# Patient Record
Sex: Female | Born: 1950 | Race: Black or African American | Hispanic: No | Smoking: Former smoker
Health system: Southern US, Community
[De-identification: ages and names within clinical notes are randomized; demographics above are authoritative.]

## PROBLEM LIST (undated history)

## (undated) DIAGNOSIS — F329 Major depressive disorder, single episode, unspecified: Secondary | ICD-10-CM

## (undated) DIAGNOSIS — K219 Gastro-esophageal reflux disease without esophagitis: Secondary | ICD-10-CM

## (undated) DIAGNOSIS — E039 Hypothyroidism, unspecified: Secondary | ICD-10-CM

## (undated) DIAGNOSIS — K635 Polyp of colon: Secondary | ICD-10-CM

## (undated) DIAGNOSIS — R519 Headache, unspecified: Secondary | ICD-10-CM

## (undated) DIAGNOSIS — B019 Varicella without complication: Secondary | ICD-10-CM

## (undated) DIAGNOSIS — R51 Headache: Secondary | ICD-10-CM

## (undated) DIAGNOSIS — F32A Depression, unspecified: Secondary | ICD-10-CM

## (undated) DIAGNOSIS — N189 Chronic kidney disease, unspecified: Secondary | ICD-10-CM

## (undated) DIAGNOSIS — E079 Disorder of thyroid, unspecified: Secondary | ICD-10-CM

## (undated) HISTORY — PX: DILATION AND CURETTAGE OF UTERUS: SHX78

## (undated) HISTORY — PX: TUBAL LIGATION: SHX77

## (undated) HISTORY — DX: Polyp of colon: K63.5

## (undated) HISTORY — DX: Varicella without complication: B01.9

## (undated) HISTORY — PX: HAMMER TOE SURGERY: SHX385

## (undated) HISTORY — DX: Gastro-esophageal reflux disease without esophagitis: K21.9

## (undated) HISTORY — DX: Headache, unspecified: R51.9

## (undated) HISTORY — DX: Headache: R51

## (undated) HISTORY — DX: Disorder of thyroid, unspecified: E07.9

---

## 1999-03-17 ENCOUNTER — Encounter: Payer: Self-pay | Admitting: Emergency Medicine

## 1999-03-17 ENCOUNTER — Emergency Department (HOSPITAL_COMMUNITY): Admission: EM | Admit: 1999-03-17 | Discharge: 1999-03-18 | Payer: Self-pay | Admitting: Emergency Medicine

## 1999-11-27 ENCOUNTER — Encounter: Payer: Self-pay | Admitting: Emergency Medicine

## 1999-11-27 ENCOUNTER — Emergency Department (HOSPITAL_COMMUNITY): Admission: EM | Admit: 1999-11-27 | Discharge: 1999-11-28 | Payer: Self-pay | Admitting: Emergency Medicine

## 2000-11-24 ENCOUNTER — Other Ambulatory Visit: Admission: RE | Admit: 2000-11-24 | Discharge: 2000-11-24 | Payer: Self-pay | Admitting: Obstetrics and Gynecology

## 2002-06-28 ENCOUNTER — Ambulatory Visit (HOSPITAL_COMMUNITY): Admission: RE | Admit: 2002-06-28 | Discharge: 2002-06-28 | Payer: Self-pay | Admitting: Family Medicine

## 2003-09-29 ENCOUNTER — Encounter: Admission: RE | Admit: 2003-09-29 | Discharge: 2003-09-29 | Payer: Self-pay | Admitting: Family Medicine

## 2003-09-30 ENCOUNTER — Encounter: Admission: RE | Admit: 2003-09-30 | Discharge: 2003-09-30 | Payer: Self-pay | Admitting: Family Medicine

## 2003-10-14 ENCOUNTER — Emergency Department (HOSPITAL_COMMUNITY): Admission: EM | Admit: 2003-10-14 | Discharge: 2003-10-14 | Payer: Self-pay | Admitting: Emergency Medicine

## 2003-10-26 ENCOUNTER — Encounter: Admission: RE | Admit: 2003-10-26 | Discharge: 2003-10-26 | Payer: Self-pay | Admitting: Family Medicine

## 2003-11-21 ENCOUNTER — Other Ambulatory Visit: Admission: RE | Admit: 2003-11-21 | Discharge: 2003-11-21 | Payer: Self-pay | Admitting: Obstetrics and Gynecology

## 2004-05-07 ENCOUNTER — Ambulatory Visit (HOSPITAL_COMMUNITY): Admission: RE | Admit: 2004-05-07 | Discharge: 2004-05-07 | Payer: Self-pay | Admitting: Gastroenterology

## 2004-10-09 ENCOUNTER — Emergency Department (HOSPITAL_COMMUNITY): Admission: EM | Admit: 2004-10-09 | Discharge: 2004-10-09 | Payer: Self-pay | Admitting: Emergency Medicine

## 2010-09-29 ENCOUNTER — Encounter: Payer: Self-pay | Admitting: Family Medicine

## 2012-05-19 ENCOUNTER — Ambulatory Visit (HOSPITAL_COMMUNITY): Payer: Self-pay

## 2012-06-09 ENCOUNTER — Other Ambulatory Visit: Payer: Self-pay | Admitting: Obstetrics and Gynecology

## 2012-06-09 DIAGNOSIS — Z1231 Encounter for screening mammogram for malignant neoplasm of breast: Secondary | ICD-10-CM

## 2012-06-10 ENCOUNTER — Encounter (HOSPITAL_COMMUNITY): Payer: Self-pay | Admitting: *Deleted

## 2012-06-11 ENCOUNTER — Emergency Department (HOSPITAL_COMMUNITY)
Admission: EM | Admit: 2012-06-11 | Discharge: 2012-06-11 | Disposition: A | Discharge status: Home / Self Care | Payer: Self-pay | Attending: Emergency Medicine | Admitting: Emergency Medicine

## 2012-06-11 ENCOUNTER — Encounter (HOSPITAL_COMMUNITY): Payer: Self-pay | Admitting: Physical Medicine and Rehabilitation

## 2012-06-11 DIAGNOSIS — K089 Disorder of teeth and supporting structures, unspecified: Secondary | ICD-10-CM | POA: Insufficient documentation

## 2012-06-11 DIAGNOSIS — K0889 Other specified disorders of teeth and supporting structures: Secondary | ICD-10-CM

## 2012-06-11 MED ORDER — NAPROXEN 500 MG PO TABS: 500.0000 mg | ORAL_TABLET | Freq: Two times a day (BID) | ORAL | Status: DC

## 2012-06-11 MED ORDER — PENICILLIN V POTASSIUM 500 MG PO TABS: 500.0000 mg | ORAL_TABLET | Freq: Four times a day (QID) | ORAL | Status: AC

## 2012-06-11 MED ORDER — IBUPROFEN 400 MG PO TABS
400.0000 mg | ORAL_TABLET | Freq: Once | ORAL | Status: AC
  Administered 2012-06-11: 400 mg via ORAL
  Filled 2012-06-11: qty 1

## 2012-06-11 MED ORDER — OXYCODONE-ACETAMINOPHEN 5-325 MG PO TABS: 1.0000 | ORAL_TABLET | ORAL | Status: DC | PRN

## 2012-06-11 NOTE — ED Notes (Signed)
Pt presents to department for evaluation of L upper and lower molar toothache. Ongoing for several days, pain increased this morning. Does not have insurance and is unable to see dentist. 9/10 pain at the time. No signs of distress noted.

## 2012-06-11 NOTE — ED Provider Notes (Signed)
History  Scribed for Raeford Razor, MD, the patient was seen in room TR05C/TR05C. This chart was scribed by Candelaria Stagers. The patient's care started at 1:43 PM   CSN: [id number]  Arrival date & time 06/11/12  1217   First MD Initiated Contact with Patient 06/11/12 1336      Chief Complaint  Patient presents with  . Dental Pain    The history is provided by the patient. No language interpreter was used.   Vlada D. Fickett is a 61 y.o. female who presents to the Emergency Department complaining of left upper and lower dental pain that stared about three weeks ago and has gotten worse.  She states she is experiencing swelling, gum pain, and left sided facial pain.  She denies trouble breathing or swallowing.  She has no other medical problems. Pt has not seen the dentist and states that she does not currently have insurance.   No past medical history on file.  No past surgical history on file.  No family history on file.  History  Substance Use Topics  . Smoking status: Never Smoker   . Smokeless tobacco: Not on file  . Alcohol Use: No    OB History    Grav Para Term Preterm Abortions TAB SAB Ect Mult Living                  Review of Systems  HENT: Positive for facial swelling and dental problem. Negative for trouble swallowing.   Respiratory: Negative for shortness of breath.   All other systems reviewed and are negative.    Allergies  Review of patient's allergies indicates no known allergies.  Home Medications  No current outpatient prescriptions on file.  BP 119/77  Pulse 92  Temp 98.2 F (36.8 C) (Oral)  Resp 16  SpO2 96%  Physical Exam  Nursing note and vitals reviewed. Constitutional: She is oriented to person, place, and time. She appears well-developed and well-nourished. No distress.  HENT:  Head: Normocephalic and atraumatic.       Dental carries.  No obv drain abs.  Tooth 17 and tooth 19 are     Eyes: EOM are normal. Pupils are equal, round,  and reactive to light.  Neck: Neck supple. No tracheal deviation present.  Cardiovascular: Normal rate.   Pulmonary/Chest: Effort normal. No respiratory distress. She has no wheezes. She has no rales.  Musculoskeletal: Normal range of motion. She exhibits no edema.  Neurological: She is alert and oriented to person, place, and time.  Skin: Skin is warm and dry.  Psychiatric: She has a normal mood and affect. Her behavior is normal.    ED Course  Procedures   DIAGNOSTIC STUDIES: Oxygen Saturation is 96% on room air, normal by my interpretation.    COORDINATION OF CARE:     Labs Reviewed - No data to display No results found.   1. Pain, dental       MDM  61 year old female with dental pain. Patient offered dental block but declining. There is no evidence of deep space neck infection. Prescriptions for pain medication was provided. Dental resources were provided as well. Return precautions were discussed.  I personally preformed the services scribed in my presence. The recorded information has been reviewed and considered. Raeford Razor, MD.        Raeford Razor, MD 06/15/12 [phone]

## 2012-06-11 NOTE — ED Notes (Signed)
Pt reports upper and lower left molar pain X3 weeks.  Unable to see a dentist.  Pain 7/10 but increases when not using oragel.  Pt alert oriented X4

## 2012-06-16 ENCOUNTER — Other Ambulatory Visit: Payer: Self-pay | Admitting: Obstetrics and Gynecology

## 2012-06-16 ENCOUNTER — Encounter (HOSPITAL_COMMUNITY): Payer: Self-pay

## 2012-06-16 ENCOUNTER — Ambulatory Visit (HOSPITAL_COMMUNITY)
Admission: RE | Admit: 2012-06-16 | Discharge: 2012-06-16 | Disposition: A | Discharge status: Home / Self Care | Payer: Self-pay | Source: Ambulatory Visit | Attending: Obstetrics and Gynecology | Admitting: Obstetrics and Gynecology

## 2012-06-16 ENCOUNTER — Other Ambulatory Visit (HOSPITAL_COMMUNITY)
Admission: RE | Admit: 2012-06-16 | Discharge: 2012-06-16 | Disposition: A | Discharge status: Home / Self Care | Payer: Self-pay | Source: Ambulatory Visit | Attending: Obstetrics and Gynecology | Admitting: Obstetrics and Gynecology

## 2012-06-16 DIAGNOSIS — Z01419 Encounter for gynecological examination (general) (routine) without abnormal findings: Secondary | ICD-10-CM | POA: Insufficient documentation

## 2012-06-16 DIAGNOSIS — Z1231 Encounter for screening mammogram for malignant neoplasm of breast: Secondary | ICD-10-CM

## 2012-06-29 NOTE — Progress Notes (Signed)
Detailed notes scanned into EPIC under media. 

## 2012-06-29 NOTE — Patient Instructions (Signed)
Detailed notes scanned into EPIC under media. 

## 2012-07-08 ENCOUNTER — Encounter (HOSPITAL_COMMUNITY): Payer: Self-pay | Admitting: *Deleted

## 2012-07-09 ENCOUNTER — Encounter (HOSPITAL_COMMUNITY): Payer: Self-pay | Admitting: *Deleted

## 2012-07-10 ENCOUNTER — Encounter (HOSPITAL_COMMUNITY): Payer: Self-pay | Admitting: *Deleted

## 2012-08-19 NOTE — Addendum Note (Signed)
Encounter addended by: Saintclair Halsted, RN on: 08/19/2012  2:50 PM<BR>     Documentation filed: Visit Diagnoses, Charges VN

## 2012-08-19 NOTE — Addendum Note (Signed)
Encounter addended by: Saintclair Halsted, RN on: 08/19/2012  2:51 PM<BR>     Documentation filed: Visit Diagnoses

## 2012-09-10 NOTE — Addendum Note (Signed)
Encounter addended by: Yamir Carignan Poll Zaiyah Sottile, RN on: 09/10/2012  4:03 PM<BR>     Documentation filed: Visit Diagnoses

## 2013-06-16 ENCOUNTER — Telehealth (HOSPITAL_COMMUNITY): Payer: Self-pay | Admitting: *Deleted

## 2013-06-16 NOTE — Telephone Encounter (Signed)
Telephoned patient at home # and left message to return call to [facility] 

## 2013-07-27 ENCOUNTER — Encounter: Payer: Self-pay | Admitting: Internal Medicine

## 2013-07-27 ENCOUNTER — Ambulatory Visit (INDEPENDENT_AMBULATORY_CARE_PROVIDER_SITE_OTHER): Payer: BC Managed Care – PPO | Admitting: Internal Medicine

## 2013-07-27 VITALS — BP 116/80 | HR 67 | Temp 97.8°F | Wt 158.2 lb

## 2013-07-27 DIAGNOSIS — Z0000 Encounter for general adult medical examination without abnormal findings: Secondary | ICD-10-CM

## 2013-07-27 DIAGNOSIS — K219 Gastro-esophageal reflux disease without esophagitis: Secondary | ICD-10-CM

## 2013-07-27 MED ORDER — PANTOPRAZOLE SODIUM 40 MG PO TBEC: 40.0000 mg | DELAYED_RELEASE_TABLET | Freq: Every day | ORAL | Status: DC

## 2013-07-27 NOTE — Progress Notes (Signed)
HPI  Pt presents to the clinic today to establish care. She has not seen a doctor in a few years. She has had some issues with her stomach. She does have real bad heartburn. It seems like more it is occuring more frequently. She does not take anything OTC for her reflux. She also c/o right breast pain. This started about 3 months. She reports that it is tender to touch but she does not feel a lump or a mass. She does not want to address the breast issue today.  Flu: never Tetanus: not up to date Pap Smear: 2013 Mammogram: 2013 Colonoscopy: 8-9 years ago Eye Doctor: as needed Dentist: as needed   Past Medical History  Diagnosis Date  . Chicken pox   . Thyroid disease   . Colon polyps   . GERD (gastroesophageal reflux disease)   . Frequent headaches     No current outpatient prescriptions on file.   No current facility-administered medications for this visit.    No Known Allergies  History reviewed. No pertinent family history.  History   Social History  . Marital Status: Single    Spouse Name: N/A    Number of Children: N/A  . Years of Education: N/A   Occupational History  . Not on file.   Social History Main Topics  . Smoking status: Never Smoker   . Smokeless tobacco: Not on file  . Alcohol Use: No  . Drug Use: No  . Sexual Activity:    Other Topics Concern  . Not on file   Social History Narrative  . No narrative on file    ROS:  Constitutional: Denies fever, malaise, fatigue, headache or abrupt weight changes.  HEENT: Denies eye pain, eye redness, ear pain, ringing in the ears, wax buildup, runny nose, nasal congestion, bloody nose, or sore throat. Respiratory: Denies difficulty breathing, shortness of breath, cough or sputum production.   Cardiovascular: Denies chest pain, chest tightness, palpitations or swelling in the hands or feet.  Gastrointestinal: Denies abdominal pain, bloating, constipation, diarrhea or blood in the stool.  GU: Denies  frequency, urgency, pain with urination, blood in urine, odor or discharge. Musculoskeletal: Denies decrease in range of motion, difficulty with gait, muscle pain or joint pain and swelling.  Skin: Denies redness, rashes, lesions or ulcercations.  Neurological: Denies dizziness, difficulty with memory, difficulty with speech or problems with balance and coordination.   No other specific complaints in a complete review of systems (except as listed in HPI above).  PE:  BP 116/80  Pulse 67  Temp(Src) 97.8 F (36.6 C) (Oral)  Wt 158 lb 4 oz (71.782 kg)  SpO2 98% Wt Readings from Last 3 Encounters:  07/27/13 158 lb 4 oz (71.782 kg)    General: Appears her stated age, overweight well developed, well nourished in NAD. HEENT: Head: normal shape and size; Eyes: sclera white, no icterus, conjunctiva pink, PERRLA and EOMs intact; Ears: Tm's gray and intact, normal light reflex; Nose: mucosa pink and moist, septum midline; Throat/Mouth: Teeth present, mucosa pink and moist, no lesions or ulcerations noted.  Neck: Normal range of motion. Neck supple, trachea midline. No massses, lumps or thyromegaly present.  Cardiovascular: Normal rate and rhythm. S1,S2 noted.  No murmur, rubs or gallops noted. No JVD or BLE edema. No carotid bruits noted. Pulmonary/Chest: Normal effort and positive vesicular breath sounds. No respiratory distress. No wheezes, rales or ronchi noted.  Abdomen: Soft and nontender. Normal bowel sounds, no bruits noted. No distention or  masses noted. Liver, spleen and kidneys non palpable. Musculoskeletal: Normal range of motion. No signs of joint swelling. No difficulty with gait.  Neurological: Alert and oriented. Cranial nerves II-XII intact. Coordination normal. +DTRs bilaterally. Psychiatric: Mood and affect normal. Behavior is normal. Judgment and thought content normal.      Assessment and Plan:  Preventative Health Maintenance:  Pt declines all screening vaccines  today Will obtain screening labs Will start protonix daily for reflux Let me know in 2 weeks if reflux not improved  RTC in 1 month to follow up GERD

## 2013-07-27 NOTE — Patient Instructions (Signed)

## 2013-07-27 NOTE — Progress Notes (Signed)
Pre-visit discussion using our clinic review tool. No additional management support is needed unless otherwise documented below in the visit note.  

## 2013-07-28 LAB — CBC
HCT: 40.9 % (ref 36.0–46.0)
Hemoglobin: 13.5 g/dL (ref 12.0–15.0)
MCV: 84.1 fl (ref 78.0–100.0)
RBC: 4.86 Mil/uL (ref 3.87–5.11)

## 2013-07-28 LAB — LIPID PANEL
Cholesterol: 208 mg/dL — ABNORMAL HIGH (ref 0–200)
HDL: 55.4 mg/dL (ref >39.00)
Total CHOL/HDL Ratio: 4
Triglycerides: 132 mg/dL (ref 0.0–149.0)
VLDL: 26.4 mg/dL (ref 0.0–40.0)

## 2013-07-28 LAB — COMPREHENSIVE METABOLIC PANEL
Alkaline Phosphatase: 63 U/L (ref 39–117)
BUN: 14 mg/dL (ref 6–23)
Creatinine, Ser: 0.9 mg/dL (ref 0.4–1.2)
Glucose, Bld: 122 mg/dL — ABNORMAL HIGH (ref 70–99)
Sodium: 137 mEq/L (ref 135–145)
Total Bilirubin: 0.5 mg/dL (ref 0.3–1.2)

## 2013-09-06 ENCOUNTER — Ambulatory Visit
Admission: RE | Admit: 2013-09-06 | Discharge: 2013-09-06 | Disposition: A | Discharge status: Home / Self Care | Payer: BC Managed Care – PPO | Source: Ambulatory Visit | Attending: Family Medicine | Admitting: Family Medicine

## 2013-09-06 ENCOUNTER — Ambulatory Visit (INDEPENDENT_AMBULATORY_CARE_PROVIDER_SITE_OTHER): Payer: BC Managed Care – PPO | Admitting: Family Medicine

## 2013-09-06 VITALS — BP 126/76 | HR 77 | Temp 97.7°F | Resp 16 | Ht 66.5 in | Wt 158.8 lb

## 2013-09-06 DIAGNOSIS — Z1239 Encounter for other screening for malignant neoplasm of breast: Secondary | ICD-10-CM

## 2013-09-06 DIAGNOSIS — M545 Low back pain, unspecified: Secondary | ICD-10-CM

## 2013-09-06 DIAGNOSIS — R1031 Right lower quadrant pain: Secondary | ICD-10-CM

## 2013-09-06 DIAGNOSIS — N200 Calculus of kidney: Secondary | ICD-10-CM

## 2013-09-06 DIAGNOSIS — R319 Hematuria, unspecified: Secondary | ICD-10-CM

## 2013-09-06 LAB — POCT URINALYSIS DIPSTICK
Bilirubin, UA: NEGATIVE
Glucose, UA: NEGATIVE
Ketones, UA: NEGATIVE
pH, UA: 6.5

## 2013-09-06 LAB — POCT UA - MICROSCOPIC ONLY
Casts, Ur, LPF, POC: NEGATIVE
Crystals, Ur, HPF, POC: NEGATIVE
Mucus, UA: NEGATIVE

## 2013-09-06 LAB — POCT CBC
Granulocyte percent: 67.8 %G (ref 37–80)
Hemoglobin: 13.9 g/dL (ref 12.2–16.2)
MID (cbc): 0.6 (ref 0–0.9)
MPV: 9.8 fL (ref 0–99.8)
POC Granulocyte: 5.5 (ref 2–6.9)
POC LYMPH PERCENT: 24.3 %L (ref 10–50)
POC MID %: 7.9 %M (ref 0–12)
RBC: 4.94 M/uL (ref 4.04–5.48)

## 2013-09-06 LAB — BASIC METABOLIC PANEL
Calcium: 9.6 mg/dL (ref 8.4–10.5)
Creat: 1.14 mg/dL — ABNORMAL HIGH (ref 0.50–1.10)
Sodium: 139 mEq/L (ref 135–145)

## 2013-09-06 MED ORDER — IOHEXOL 300 MG/ML  SOLN
100.0000 mL | Freq: Once | INTRAMUSCULAR | Status: AC | PRN
  Administered 2013-09-06: 100 mL via INTRAVENOUS

## 2013-09-06 MED ORDER — IOHEXOL 300 MG/ML  SOLN
30.0000 mL | Freq: Once | INTRAMUSCULAR | Status: AC | PRN
  Administered 2013-09-06: 30 mL via ORAL

## 2013-09-06 MED ORDER — HYDROCODONE-ACETAMINOPHEN 5-325 MG PO TABS: 1.0000 | ORAL_TABLET | Freq: Four times a day (QID) | ORAL | Status: DC | PRN

## 2013-09-06 MED ORDER — TAMSULOSIN HCL 0.4 MG PO CAPS: 0.4000 mg | ORAL_CAPSULE | Freq: Every day | ORAL | Status: DC

## 2013-09-06 NOTE — Patient Instructions (Addendum)
Go to [facility] @[address] for your CT scan I will give you a call as soon as I get your CT results back.

## 2013-09-06 NOTE — Progress Notes (Addendum)
[facility] [address], [place] [address] [phone]  Date:  09/06/2013   Name:  Rachael Powell   DOB:  [birth year]   MRN:  [id number]  PCP:  Nicki Reaper, NP    Chief Complaint: Back Pain and Nausea   History of Present Illness:  Rachael Powell is a 62 y.o. very pleasant female patient who presents with the following:  Here today with illness.  Yesterday she noted onset of pain in her lower back.  The pain is now in her right flank and RLQ.   She has noted nausea but no vomiting.  She is trying hard not to vomit.   She feels like she has labor pains or "menstrual cramps."  She has noted urinary frequency and some dysuria.   She has not noted any blood in her urine, but there was some light- red color on the paper when she went to wipe today.  She has had a kidney stone in the past.  She is not sure if this is the same thing.  She had this stone "years" ago.  She did not require any procedure to help it to pass.    She has a history of GERD but is otherwise generally healthy.   She took a couple of aleve around 0500 this am.   She does need a referral to get a screening mammogram as well  Admits that she did not feel like eating today.  She did eat dinner last night but "I had to force myself to eat that."   There are no active problems to display for this patient.   Past Medical History  Diagnosis Date  . Chicken pox   . Thyroid disease   . Colon polyps   . GERD (gastroesophageal reflux disease)   . Frequent headaches     Past Surgical History  Procedure Laterality Date  . Dilation and curettage of uterus    . Hammer toe surgery    . Tubal ligation      History  Substance Use Topics  . Smoking status: Never Smoker   . Smokeless tobacco: Not on file  . Alcohol Use: No    Family History  Problem Relation Age of Onset  . Diabetes Mother   . Diabetes Father     No Known Allergies  Medication list has been reviewed and  updated.  Current Outpatient Prescriptions on File Prior to Visit  Medication Sig Dispense Refill  . pantoprazole (PROTONIX) 40 MG tablet Take 1 tablet (40 mg total) by mouth daily.  30 tablet  3   No current facility-administered medications on file prior to visit.    Review of Systems:  As per HPI- otherwise negative.   Physical Examination: Filed Vitals:   09/06/13 0953  BP: 126/76  Pulse: 77  Temp: 97.7 F (36.5 C)  Resp: 16   Filed Vitals:   09/06/13 0953  Height: 5' 6.5" (1.689 m)  Weight: 158 lb 12.8 oz (72.031 kg)   Body mass index is 25.25 kg/(m^2). Ideal Body Weight: Weight in (lb) to have BMI = 25: 156.9  GEN: WDWN, NAD, Non-toxic, A & O x 3, looks well HEENT: Atraumatic, Normocephalic. Neck supple. No masses, No LAD. Ears and Nose: No external deformity. CV: RRR, No M/G/R. No JVD. No thrill. No extra heart sounds. PULM: CTA B, no wheezes, crackles, rhonchi. No retractions. No resp. distress. No accessory muscle use. ABD: S, ND, +BS. No rebound. No  HSM.  RLQ tenderness and guarding.   EXTR: No c/c/e NEURO Normal gait.  PSYCH: Normally interactive. Conversant. Not depressed or anxious appearing.  Calm demeanor.  Gu: no evidence of bleeding from the uterus.  No CMT, no adnexal masses or tenderness, no lesions or discharge  Results for orders placed in visit on 09/06/13  BASIC METABOLIC PANEL      Result Value Range   Sodium 139  135 - 145 mEq/L   Potassium 4.8  3.5 - 5.3 mEq/L   Chloride 103  96 - 112 mEq/L   CO2 24  19 - 32 mEq/L   Glucose, Bld 103 (*) 70 - 99 mg/dL   BUN 16  6 - 23 mg/dL   Creat 0.45 (*) 4.09 - 1.10 mg/dL   Calcium 9.6  8.4 - 81.1 mg/dL  POCT URINALYSIS DIPSTICK      Result Value Range   Color, UA brown     Clarity, UA turbid     Glucose, UA neg     Bilirubin, UA neg     Ketones, UA neg     Spec Grav, UA 1.025     Blood, UA large     pH, UA 6.5     Protein, UA 100     Urobilinogen, UA 1.0     Nitrite, UA nege      Leukocytes, UA Trace    POCT UA - MICROSCOPIC ONLY      Result Value Range   WBC, Ur, HPF, POC 0-2     RBC, urine, microscopic 25-43     Bacteria, U Microscopic trace     Mucus, UA neg     Epithelial cells, urine per micros 0-3     Crystals, Ur, HPF, POC neg     Casts, Ur, LPF, POC neg     Yeast, UA neg    POCT CBC      Result Value Range   WBC 8.1  4.6 - 10.2 K/uL   Lymph, poc 2.0  0.6 - 3.4   POC LYMPH PERCENT 24.3  10 - 50 %L   MID (cbc) 0.6  0 - 0.9   POC MID % 7.9  0 - 12 %M   POC Granulocyte 5.5  2 - 6.9   Granulocyte percent 67.8  37 - 80 %G   RBC 4.94  4.04 - 5.48 M/uL   Hemoglobin 13.9  12.2 - 16.2 g/dL   HCT, POC 91.4  78.2 - 47.9 %   MCV 90.0  80 - 97 fL   MCH, POC 28.1  27 - 31.2 pg   MCHC 31.2 (*) 31.8 - 35.4 g/dL   RDW, POC 95.6     Platelet Count, POC 276  142 - 424 K/uL   MPV 9.8  0 - 99.8 fL    Assessment and Plan: Lower back pain - Plan: POCT urinalysis dipstick, POCT UA - Microscopic Only  RLQ abdominal pain - Plan: POCT CBC, Basic metabolic panel, CT Abdomen Pelvis W Contrast, HYDROcodone-acetaminophen (NORCO/VICODIN) 5-325 MG per tablet  Hematuria - Plan: Urine culture  RLQ pain and hematuraia.  Likely nephrolithaisis, also consider appendicitis.  She would like to proceed with a Ct scan today  Signed Abbe Amsterdam, MD  Sent for CT scan:   CT ABDOMEN AND PELVIS WITH CONTRAST  TECHNIQUE: Multidetector CT imaging of the abdomen and pelvis was performed using the standard protocol following bolus administration of intravenous contrast.  CONTRAST: OMNIPAQUE IOHEXOL 300 MG/ML SOLN,  30mL OMNIPAQUE IOHEXOL 300 MG/ML SOLN  COMPARISON: Prior CT from 10/09/2004  FINDINGS: The visualized lung bases are clear. Visualized heart is normal in size with normal appearance. No pericardial effusion.  The liver demonstrates a normal contrast enhanced appearance. No focal intrahepatic masses. Gallbladder is within normal limits. No biliary ductal  dilatation. The spleen, adrenal glands, and pancreas demonstrate a normal contrast enhanced appearance.  The left kidney is within normal limits without evidence of hydronephrosis or nephrolithiasis. No focal enhancing renal mass. No stones seen along the course of the left renal collecting system.  An 8 mm obstructive stone is present within the distal right ureter (series 2, image 72). There is secondary moderate hydroureteronephrosis proximally. Mild right perinephric and periureteral fat stranding is present. No other stone seen within the right renal collecting system or within the right kidney.  No evidence of bowel obstruction. Appendix is well visualized in the right lower quadrant and is of normal caliber and appearance without associated inflammatory changes to suggest acute appendicitis.  Bladder is partially distended but grossly normal. Uterus and ovaries are within normal limits.  No free air or fluid identified. No pathologically enlarged intra-abdominal pelvic lymph nodes.  Osseous structures are within normal limits. No focal lytic or blastic osseous lesions identified.  IMPRESSION: 1. 8 mm obstructive stone within the distal right ureter with secondary moderate hydroureteronephrosis. 2. Normal appendix.  Called pt and let her know.  She has a large stone which may require intervention to pass.  She will see urology tomorrow; discussed over the phone with Dr. Annabell Howells.  For the time being will use vicodin for pain and flomax one a day.    Called in the evening to check on her- the vicodin is helping with her pain, and she has an appt tomorrow at noon.  Let her know that her creat is a bit elevated.  This is likely due to her current stone. Plan recheck BMP in 3- 4 weeks, will leave an order on her chart for a lab visit only.    Called 12/30: her urine culture is also positive.  May be a contaminant but will treat with abx due to large number of CFU.  Keflex rx to her  drug store.

## 2013-09-06 NOTE — Addendum Note (Signed)
Addended by: Abbe Amsterdam C on: 09/06/2013 08:17 PM   Modules accepted: Orders

## 2013-09-07 MED ORDER — CEPHALEXIN 500 MG PO CAPS: 500.0000 mg | ORAL_CAPSULE | Freq: Two times a day (BID) | ORAL | Status: DC

## 2013-09-07 NOTE — Addendum Note (Signed)
Addended by: Abbe Amsterdam C on: 09/07/2013 03:22 PM   Modules accepted: Orders

## 2013-09-08 LAB — URINE CULTURE

## 2013-09-13 ENCOUNTER — Encounter (HOSPITAL_COMMUNITY): Payer: Self-pay | Admitting: Pharmacy Technician

## 2013-09-13 ENCOUNTER — Other Ambulatory Visit: Payer: Self-pay | Admitting: Urology

## 2013-09-14 ENCOUNTER — Encounter (HOSPITAL_COMMUNITY): Payer: Self-pay | Admitting: *Deleted

## 2013-09-14 ENCOUNTER — Telehealth: Payer: Self-pay

## 2013-09-14 NOTE — Telephone Encounter (Signed)
Can you approve the note? I can write it for her if you authorize

## 2013-09-14 NOTE — Telephone Encounter (Signed)
Sure, I will write this and send it to her mychart account.  Will notify pt

## 2013-09-14 NOTE — Telephone Encounter (Signed)
PT STATES SHE IS BEING REFERRED TO HAVE A KIDNEY STONE REMOVED ON THE 12TH, NEED A NOTE FOR WORK FROM 12/29 UNTIL THE 12TH, PLEASE CALL [phone] WHEN READY FOR PICK UP

## 2013-09-19 MED ORDER — GENTAMICIN SULFATE 40 MG/ML IJ SOLN
5.0000 mg/kg | INTRAVENOUS | Status: AC
  Administered 2013-09-20: 360 mg via INTRAVENOUS
  Filled 2013-09-19: qty 9

## 2013-09-20 ENCOUNTER — Encounter (HOSPITAL_COMMUNITY)
Admission: RE | Disposition: A | Discharge status: Home / Self Care | Payer: Self-pay | Source: Ambulatory Visit | Attending: Urology

## 2013-09-20 ENCOUNTER — Ambulatory Visit (HOSPITAL_COMMUNITY)
Admission: RE | Admit: 2013-09-20 | Discharge: 2013-09-20 | Disposition: A | Discharge status: Home / Self Care | Payer: BC Managed Care – PPO | Source: Ambulatory Visit | Attending: Urology | Admitting: Urology

## 2013-09-20 ENCOUNTER — Ambulatory Visit (HOSPITAL_COMMUNITY): Payer: BC Managed Care – PPO

## 2013-09-20 ENCOUNTER — Encounter (HOSPITAL_COMMUNITY): Payer: Self-pay | Admitting: General Practice

## 2013-09-20 DIAGNOSIS — N1330 Unspecified hydronephrosis: Secondary | ICD-10-CM | POA: Insufficient documentation

## 2013-09-20 DIAGNOSIS — R1031 Right lower quadrant pain: Secondary | ICD-10-CM

## 2013-09-20 DIAGNOSIS — N201 Calculus of ureter: Secondary | ICD-10-CM | POA: Insufficient documentation

## 2013-09-20 DIAGNOSIS — K219 Gastro-esophageal reflux disease without esophagitis: Secondary | ICD-10-CM | POA: Insufficient documentation

## 2013-09-20 HISTORY — DX: Major depressive disorder, single episode, unspecified: F32.9

## 2013-09-20 HISTORY — DX: Chronic kidney disease, unspecified: N18.9

## 2013-09-20 HISTORY — DX: Hypothyroidism, unspecified: E03.9

## 2013-09-20 HISTORY — DX: Depression, unspecified: F32.A

## 2013-09-20 SURGERY — LITHOTRIPSY, ESWL
Anesthesia: LOCAL | Laterality: Right

## 2013-09-20 MED ORDER — HYDROCODONE-ACETAMINOPHEN 5-325 MG PO TABS: 1.0000 | ORAL_TABLET | Freq: Four times a day (QID) | ORAL | Status: DC | PRN

## 2013-09-20 MED ORDER — DIAZEPAM 5 MG PO TABS
10.0000 mg | ORAL_TABLET | ORAL | Status: AC
  Administered 2013-09-20: 10 mg via ORAL
  Filled 2013-09-20: qty 2

## 2013-09-20 MED ORDER — DIPHENHYDRAMINE HCL 25 MG PO CAPS
25.0000 mg | ORAL_CAPSULE | ORAL | Status: AC
  Administered 2013-09-20: 25 mg via ORAL
  Filled 2013-09-20: qty 1

## 2013-09-20 MED ORDER — SODIUM CHLORIDE 0.9 % IV SOLN
INTRAVENOUS | Status: DC
  Administered 2013-09-20: 14:00:00 via INTRAVENOUS

## 2013-09-20 NOTE — H&P (Signed)
Rachael Powell is an 63 y.o. female.    Chief Complaint: Pre-OP Rt Shockwave Lithotripsy  HPI:   1 - Nephrolithiasis / Rt Distal Ureteral Stone -  Pre 2014 - one episode urolithiasis managed medically 08/2013 - CT with Rt 8mm distal stone (no others), SSD 9cm, 850HU, and moderate hydro on w/u colikcly flank pain + nausea by PCP.  PMH sig for BTL, foot surgery. No CV disease. No strong blood thinners. Presently not established with regualar PCP.  Today Rachael Powell is seen to proceed with Rt shockwave lithotripsy. No interval fevers or stone passage. Most recent UCX negative.   Past Medical History  Diagnosis Date  . Chicken pox   . Thyroid disease   . Colon polyps   . GERD (gastroesophageal reflux disease)   . Frequent headaches   . Hypothyroidism     possible low thyroid. No meds  . Depression     no medication  . Chronic kidney disease     right renal stone    Past Surgical History  Procedure Laterality Date  . Dilation and curettage of uterus    . Hammer toe surgery    . Tubal ligation      Family History  Problem Relation Age of Onset  . Diabetes Mother   . Diabetes Father    Social History:  reports that she has never smoked. She does not have any smokeless tobacco history on file. She reports that she does not drink alcohol or use illicit drugs.  Allergies: No Known Allergies  No prescriptions prior to admission    No results found for this or any previous visit (from the past 48 hour(s)). No results found.  Review of Systems  Constitutional: Negative.  Negative for fever and chills.  HENT: Negative.   Eyes: Negative.   Respiratory: Negative.   Cardiovascular: Negative.   Gastrointestinal: Negative.  Negative for nausea and vomiting.  Genitourinary: Positive for flank pain.  Musculoskeletal: Negative.   Skin: Negative.   Neurological: Negative.   Endo/Heme/Allergies: Negative.   Psychiatric/Behavioral: Negative.     There were no vitals taken for  this visit. Physical Exam  Constitutional: She appears well-developed and well-nourished.  HENT:  Head: Normocephalic and atraumatic.  Eyes: EOM are normal. Pupils are equal, round, and reactive to light.  Neck: Normal range of motion. Neck supple.  Cardiovascular: Normal rate.   Respiratory: Effort normal.  GI: Soft. Bowel sounds are normal.  Genitourinary:  Mild Rt CVAT  Musculoskeletal: Normal range of motion.  Neurological: She is alert.  Skin: Skin is warm and dry.  Psychiatric: She has a normal mood and affect. Her behavior is normal. Judgment and thought content normal.     Assessment/Plan  1 - Nephrolithiasis / Rt Distal Ureteral Stone - solitary large distal stone statistically unlikley to pass with medical therapy alone. Good SWL candidate in terms of size / density, solitary stone.   We rediscussed shockwave lithotripsy in detail as well as my "rule of 9s" with stones <49mm, less than 900 HU, and skin to stone distance <9cm having approximately 90% treatment success with single session of treatment. We then readdressed how stones that are larger, more dense, and in patients with less favorable anatomy have incrementally decreased success rates. We rediscussed risks including, bleeding, infection, hematoma, loss of kidney, need for staged therapy, need for adjunctive therapy and requirement to refrain from any anticoagulants, anti-platelet or aspirin-like products peri-procedureally. After careful consideration, the patient has chosen to proceed today as  planned.    Rachael Powell 09/20/2013, 6:23 AM

## 2013-09-20 NOTE — Discharge Instructions (Signed)
1 - Call MD or go to ER for fever >102, severe pain / nausea / vomiting not relieved by medications, or acute change in medical status  

## 2013-09-20 NOTE — Progress Notes (Signed)
Unable to visualize pt's stone on the lithotripsy van, procedure cancelled.

## 2013-10-06 ENCOUNTER — Ambulatory Visit
Admission: RE | Admit: 2013-10-06 | Discharge: 2013-10-06 | Disposition: A | Discharge status: Home / Self Care | Payer: BC Managed Care – PPO | Source: Ambulatory Visit | Attending: Family Medicine | Admitting: Family Medicine

## 2013-10-06 DIAGNOSIS — Z1239 Encounter for other screening for malignant neoplasm of breast: Secondary | ICD-10-CM

## 2014-01-17 ENCOUNTER — Encounter: Payer: Self-pay | Admitting: Family Medicine

## 2014-06-24 ENCOUNTER — Other Ambulatory Visit: Payer: Self-pay

## 2014-08-03 ENCOUNTER — Ambulatory Visit (INDEPENDENT_AMBULATORY_CARE_PROVIDER_SITE_OTHER): Payer: BC Managed Care – PPO | Admitting: Emergency Medicine

## 2014-08-03 VITALS — BP 142/90 | HR 77 | Temp 97.8°F | Resp 18 | Ht 65.0 in | Wt 160.0 lb

## 2014-08-03 DIAGNOSIS — K088 Other specified disorders of teeth and supporting structures: Secondary | ICD-10-CM

## 2014-08-03 DIAGNOSIS — H6123 Impacted cerumen, bilateral: Secondary | ICD-10-CM

## 2014-08-03 DIAGNOSIS — K0889 Other specified disorders of teeth and supporting structures: Secondary | ICD-10-CM

## 2014-08-03 MED ORDER — ACETAMINOPHEN-CODEINE #3 300-30 MG PO TABS: 1.0000 | ORAL_TABLET | ORAL | Status: DC | PRN

## 2014-08-03 MED ORDER — CEPHALEXIN 500 MG PO CAPS: 500.0000 mg | ORAL_CAPSULE | Freq: Four times a day (QID) | ORAL | Status: DC

## 2014-08-03 NOTE — Patient Instructions (Signed)
Cerumen Impaction °A cerumen impaction is when the wax in your ear forms a plug. This plug usually causes reduced hearing. Sometimes it also causes an earache or dizziness. Removing a cerumen impaction can be difficult and painful. The wax sticks to the ear canal. The canal is sensitive and bleeds easily. If you try to remove a heavy wax buildup with a cotton tipped swab, you may push it in further. °Irrigation with water, suction, and small ear curettes may be used to clear out the wax. If the impaction is fixed to the skin in the ear canal, ear drops may be needed for a few days to loosen the wax. People who build up a lot of wax frequently can use ear wax removal products available in your local drugstore. °SEEK MEDICAL CARE IF:  °You develop an earache, increased hearing loss, or marked dizziness. °Document Released: 10/03/2004 Document Revised: 11/18/2011 Document Reviewed: 11/23/2009 °[employer]® Patient Information ©2015 [employer]. This information is not intended to replace advice given to you by your health care provider. Make sure you discuss any questions you have with your health care provider. ° °

## 2014-08-03 NOTE — Progress Notes (Signed)
[facility] [address], [place] [id number] [phone]  Date:  08/03/2014   Name:  Rachael CraftsRochelle Powell   DOB:  07/22/1951   MRN:  [id number]  PCP:  Nicki ReaperBAITY, REGINA, NP    Chief Complaint: gum pain; Sore Throat; and Neck Pain   History of Present Illness:  Rachael Powell is a 63 y.o. very pleasant female patient who presents with  Complains of pain in the right ear two weeks ago that has mostly resolved.  No cough or coryza.  No fever or chills Now has pain in the right mandibular molars Some swelling and pain with temp change.  No broken tooth or lost filling. No thermal sensitivity. No improvement with over the counter medications or other home remedies.  Denies other complaint or health concern today.   There are no active problems to display for this patient.   Past Medical History  Diagnosis Date  . Chicken pox   . Thyroid disease   . Colon polyps   . GERD (gastroesophageal reflux disease)   . Frequent headaches   . Hypothyroidism     possible low thyroid. No meds  . Depression     no medication  . Chronic kidney disease     right renal stone    Past Surgical History  Procedure Laterality Date  . Dilation and curettage of uterus    . Hammer toe surgery    . Tubal ligation      History  Substance Use Topics  . Smoking status: Never Smoker   . Smokeless tobacco: Not on file  . Alcohol Use: No    Family History  Problem Relation Age of Onset  . Diabetes Mother   . Diabetes Father     No Known Allergies  Medication list has been reviewed and updated.  Current Outpatient Prescriptions on File Prior to Visit  Medication Sig Dispense Refill  . docusate sodium (COLACE) 100 MG capsule Take 300 mg by mouth every evening.    Marland Kitchen. HYDROcodone-acetaminophen (NORCO/VICODIN) 5-325 MG per tablet Take 1 tablet by mouth every 6 (six) hours as needed. (Patient not taking: Reported on 08/03/2014) 30 tablet 0  . tamsulosin (FLOMAX) 0.4 MG CAPS  capsule Take 1 capsule (0.4 mg total) by mouth daily. Use as needed to help pass kidney stone (Patient not taking: Reported on 08/03/2014) 30 capsule 3   No current facility-administered medications on file prior to visit.    Review of Systems:  As per HPI, otherwise negative.    Physical Examination: Filed Vitals:   08/03/14 1348  BP: 142/90  Pulse: 77  Temp: 97.8 F (36.6 C)  Resp: 18   Filed Vitals:   08/03/14 1348  Height: 5\' 5"  (1.651 m)  Weight: 160 lb (72.576 kg)   Body mass index is 26.63 kg/(m^2). Ideal Body Weight: Weight in (lb) to have BMI = 25: 149.9  GEN: WDWN, NAD, Non-toxic, A & O x 3 HEENT: Atraumatic, Normocephalic. Neck supple. No masses, No LAD.  Tender to percussion #30 Ears and Nose: No external deformity.  Bilateral cerumen impaction CV: RRR, No M/G/R. No JVD. No thrill. No extra heart sounds. PULM: CTA B, no wheezes, crackles, rhonchi. No retractions. No resp. distress. No accessory muscle use. ABD: S, NT, ND, +BS. No rebound. No HSM. EXTR: No c/c/e NEURO Normal gait.  PSYCH: Normally interactive. Conversant. Not depressed or anxious appearing.  Calm demeanor.    Assessment and Plan: Dental pain tyl #3 Keflex  Signed,  Ellison Carwin, MD

## 2014-08-06 ENCOUNTER — Other Ambulatory Visit: Payer: Self-pay | Admitting: Emergency Medicine

## 2014-09-20 ENCOUNTER — Other Ambulatory Visit: Payer: Self-pay

## 2014-09-20 DIAGNOSIS — Z1231 Encounter for screening mammogram for malignant neoplasm of breast: Secondary | ICD-10-CM

## 2014-11-07 ENCOUNTER — Ambulatory Visit: Payer: Self-pay

## 2014-12-02 ENCOUNTER — Ambulatory Visit: Payer: Self-pay

## 2015-01-04 ENCOUNTER — Encounter: Payer: Self-pay | Admitting: Physician Assistant

## 2015-01-04 ENCOUNTER — Other Ambulatory Visit (HOSPITAL_COMMUNITY): Payer: Self-pay | Admitting: Cardiology

## 2015-01-04 ENCOUNTER — Telehealth: Payer: Self-pay

## 2015-01-04 ENCOUNTER — Ambulatory Visit (HOSPITAL_COMMUNITY): Payer: BC Managed Care – PPO | Attending: Internal Medicine | Admitting: *Deleted

## 2015-01-04 ENCOUNTER — Ambulatory Visit (INDEPENDENT_AMBULATORY_CARE_PROVIDER_SITE_OTHER): Payer: BC Managed Care – PPO | Admitting: Physician Assistant

## 2015-01-04 VITALS — BP 118/73 | HR 77 | Temp 98.1°F | Resp 16 | Ht 64.0 in | Wt 164.6 lb

## 2015-01-04 DIAGNOSIS — Z1329 Encounter for screening for other suspected endocrine disorder: Secondary | ICD-10-CM | POA: Diagnosis not present

## 2015-01-04 DIAGNOSIS — K219 Gastro-esophageal reflux disease without esophagitis: Secondary | ICD-10-CM

## 2015-01-04 DIAGNOSIS — R110 Nausea: Secondary | ICD-10-CM

## 2015-01-04 DIAGNOSIS — M79604 Pain in right leg: Secondary | ICD-10-CM

## 2015-01-04 DIAGNOSIS — M7989 Other specified soft tissue disorders: Secondary | ICD-10-CM | POA: Diagnosis not present

## 2015-01-04 LAB — COMPREHENSIVE METABOLIC PANEL
ALT: 21 U/L (ref 0–35)
AST: 19 U/L (ref 0–37)
Albumin: 4.4 g/dL (ref 3.5–5.2)
Alkaline Phosphatase: 59 U/L (ref 39–117)
BUN: 15 mg/dL (ref 6–23)
CALCIUM: 9.3 mg/dL (ref 8.4–10.5)
CHLORIDE: 104 meq/L (ref 96–112)
CO2: 25 meq/L (ref 19–32)
Creat: 0.94 mg/dL (ref 0.50–1.10)
GLUCOSE: 149 mg/dL — AB (ref 70–99)
Potassium: 4.1 mEq/L (ref 3.5–5.3)
Sodium: 139 mEq/L (ref 135–145)
TOTAL PROTEIN: 7.5 g/dL (ref 6.0–8.3)
Total Bilirubin: 0.6 mg/dL (ref 0.2–1.2)

## 2015-01-04 LAB — CBC
HCT: 40.4 % (ref 36.0–46.0)
Hemoglobin: 13.3 g/dL (ref 12.0–15.0)
MCH: 27.7 pg (ref 26.0–34.0)
MCHC: 32.9 g/dL (ref 30.0–36.0)
MCV: 84 fL (ref 78.0–100.0)
MPV: 10.4 fL (ref 8.6–12.4)
PLATELETS: 296 10*3/uL (ref 150–400)
RBC: 4.81 MIL/uL (ref 3.87–5.11)
RDW: 13 % (ref 11.5–15.5)
WBC: 4.7 10*3/uL (ref 4.0–10.5)

## 2015-01-04 LAB — TSH: TSH: 3.438 u[IU]/mL (ref 0.350–4.500)

## 2015-01-04 MED ORDER — OMEPRAZOLE 40 MG PO CPDR: 40.0000 mg | DELAYED_RELEASE_CAPSULE | Freq: Every day | ORAL | Status: DC

## 2015-01-04 NOTE — Patient Instructions (Addendum)
Go to [address] [facility] today   Your symptoms may be caused by acid reflux. Please follow the instructions below to minimize your symptoms. Please take the omeprazole 40 mg per day for about 8 weeks. I would like to see you back at that point as we'll want to start working you off the medication if possible.  Please let us know if your nausea and burning persist despite this.  I'm checking labs today for the nausea and will let you know these results.  I'm checking a breath test to make sure you don't have an infection causing your symptoms. For the leg swelling, we need to get an ultrasound today to make sure you don't have a DVT (read below).   Deep Vein Thrombosis A deep vein thrombosis (DVT) is a blood clot that develops in the deep, larger veins of the leg, arm, or pelvis. These are more dangerous than clots that might form in veins near the surface of the body. A DVT can lead to serious and even life-threatening complications if the clot breaks off and travels in the bloodstream to the lungs.  A DVT can damage the valves in your leg veins so that instead of flowing upward, the blood pools in the lower leg. This is called post-thrombotic syndrome, and it can result in pain, swelling, discoloration, and sores on the leg. CAUSES Usually, several things contribute to the formation of blood clots. Contributing factors include:  The flow of blood slows down.  The inside of the vein is damaged in some way.  You have a condition that makes blood clot more easily. RISK FACTORS Some people are more likely than others to develop blood clots. Risk factors include:   Smoking.  Being overweight (obese).  Sitting or lying still for a long time. This includes long-distance travel, paralysis, or recovery from an illness or surgery. Other factors that increase risk are:   Older age, especially over 68 years of age.  Having a family history of blood clots or if you  have already had a blot clot.  Having major or lengthy surgery. This is especially true for surgery on the hip, knee, or belly (abdomen). Hip surgery is particularly high risk.  Having a long, thin tube (catheter) placed inside a vein during a medical procedure.  Breaking a hip or leg.  Having cancer or cancer treatment.  Pregnancy and childbirth.  Hormone changes make the blood clot more easily during pregnancy.  The fetus puts pressure on the veins of the pelvis.  There is a risk of injury to veins during delivery or a caesarean delivery. The risk is highest just after childbirth.  Medicines containing the female hormone estrogen. This includes birth control pills and hormone replacement therapy.  Other circulation or heart problems.  SIGNS AND SYMPTOMS When a clot forms, it can either partially or totally block the blood flow in that vein. Symptoms of a DVT can include:  Swelling of the leg or arm, especially if one side is much worse.  Warmth and redness of the leg or arm, especially if one side is much worse.  Pain in an arm or leg. If the clot is in the leg, symptoms may be more noticeable or worse when standing or walking. The symptoms of a DVT that has traveled to the lungs (pulmonary embolism, PE) usually start suddenly and include:  Shortness of breath.  Coughing.  Coughing up blood or blood-tinged mucus.  Chest pain.  The chest pain is often worse with deep breaths.  Rapid heartbeat. Anyone with these symptoms should get emergency medical treatment right away. Do not wait to see if the symptoms will go away. Call your local emergency services (911 in the [place] if you have these symptoms. Do not drive yourself to the hospital. DIAGNOSIS If a DVT is suspected, your health care provider will take a full medical history and perform a physical exam. Tests that also may be required include:  Blood tests, including studies of the clotting properties of the  blood.  Ultrasound to see if you have clots in your legs or lungs.  X-rays to show the flow of blood when dye is injected into the veins (venogram).  Studies of your lungs if you have any chest symptoms. PREVENTION  Exercise the legs regularly. Take a brisk 30-minute walk every day.  Maintain a weight that is appropriate for your height.  Avoid sitting or lying in bed for long periods of time without moving your legs.  Women, particularly those over the age of 35 years, should consider the risks and benefits of taking estrogen medicines, including birth control pills.  Do not smoke, especially if you take estrogen medicines.  Long-distance travel can increase your risk of DVT. You should exercise your legs by walking or pumping the muscles every hour.  Many of the risk factors above relate to situations that exist with hospitalization, either for illness, injury, or elective surgery. Prevention may include medical and nonmedical measures.  Your health care provider will assess you for the need for venous thromboembolism prevention when you are admitted to the hospital. If you are having surgery, your surgeon will assess you the day of or day after surgery. TREATMENT Once identified, a DVT can be treated. It can also be prevented in some circumstances. Once you have had a DVT, you may be at increased risk for a DVT in the future. The most common treatment for DVT is blood-thinning (anticoagulant) medicine, which reduces the blood's tendency to clot. Anticoagulants can stop new blood clots from forming and stop old clots from growing. They cannot dissolve existing clots. Your body does this by itself over time. Anticoagulants can be given by mouth, through an IV tube, or by injection. Your health care provider will determine the best program for you. Other medicines or treatments that may be used are:  Heparin or related medicines (low molecular weight heparin) are often the first treatment  for a blood clot. They act quickly. However, they cannot be taken orally and must be given either in shot form or by IV tube.  Heparin can cause a fall in a component of blood that stops bleeding and forms blood clots (platelets). You will be monitored with blood tests to be sure this does not occur.  Warfarin is an anticoagulant that can be swallowed. It takes a few days to start working, so usually heparin or related medicines are used in combination. Once warfarin is working, heparin is usually stopped.  Factor Xa inhibitor medicines, such as rivaroxaban and apixaban, also reduce blood clotting. These medicines are taken orally and can often be used without heparin or related medicines.  Less commonly, clot dissolving drugs (thrombolytics) are used to dissolve a DVT. They carry a high risk of bleeding, so they are used mainly in severe cases where your life or a part of your body is threatened.  Very rarely, a blood clot in the leg needs to be removed surgically.  If you are unable to take anticoagulants, your health care provider may arrange for you to have a filter placed in a main vein in your abdomen. This filter prevents clots from traveling to your lungs. HOME CARE INSTRUCTIONS  Take all medicines as directed by your health care provider.  Learn as much as you can about DVT.  Wear a medical alert bracelet or carry a medical alert card.  Ask your health care provider how soon you can go back to normal activities. It is important to stay active to prevent blood clots. If you are on anticoagulant medicine, avoid contact sports.  It is very important to exercise. This is especially important while traveling, sitting, or standing for long periods of time. Exercise your legs by walking or by tightening and relaxing your leg muscles regularly. Take frequent walks.  You may need to wear compression stockings. These are tight elastic stockings that apply pressure to the lower legs. This  pressure can help keep the blood in the legs from clotting. Taking Warfarin Warfarin is a daily medicine that is taken by mouth. Your health care provider will advise you on the length of treatment (usually 3-6 months, sometimes lifelong). If you take warfarin:  Understand how to take warfarin and foods that can affect how warfarin works in Public relations account executive.  Too much and too little warfarin are both dangerous. Too much warfarin increases the risk of bleeding. Too little warfarin continues to allow the risk for blood clots. Warfarin and Regular Blood Testing While taking warfarin, you will need to have regular blood tests to measure your blood clotting time. These blood tests usually include both the prothrombin time (PT) and international normalized ratio (INR) tests. The PT and INR results allow your health care provider to adjust your dose of warfarin. It is very important that you have your PT and INR tested as often as directed by your health care provider.  Warfarin and Your Diet Avoid major changes in your diet, or notify your health care provider before changing your diet. Arrange a visit with a registered dietitian to answer your questions. Many foods, especially foods high in vitamin K, can interfere with warfarin and affect the PT and INR results. You should eat a consistent amount of foods high in vitamin K. Foods high in vitamin K include:   Spinach, kale, broccoli, cabbage, collard and turnip greens, Brussels sprouts, peas, cauliflower, seaweed, and parsley.  Beef and pork liver.  Green tea.  Soybean oil. Warfarin with Other Medicines Many medicines can interfere with warfarin and affect the PT and INR results. You must:  Tell your health care provider about any and all medicines, vitamins, and supplements you take, including aspirin and other over-the-counter anti-inflammatory medicines. Be especially cautious with aspirin and anti-inflammatory medicines. Ask your health care provider  before taking these.  Do not take or discontinue any prescribed or over-the-counter medicine except on the advice of your health care provider or pharmacist. Warfarin Side Effects Warfarin can have side effects, such as easy bruising and difficulty stopping bleeding. Ask your health care provider or pharmacist about other side effects of warfarin. You will need to:  Hold pressure over cuts for longer than usual.  Notify your dentist and other health care providers that you are taking warfarin before you undergo any procedures where bleeding may occur. Warfarin with Alcohol and Tobacco   Drinking alcohol frequently can increase the effect of warfarin, leading to excess bleeding. It is best to avoid alcoholic drinks  or to consume only very small amounts while taking warfarin. Notify your health care provider if you change your alcohol intake.   Do not use any tobacco products including cigarettes, chewing tobacco, or electronic cigarettes. If you smoke, quit. Ask your health care provider for help with quitting smoking. Alternative Medicines to Warfarin: Factor Xa Inhibitor Medicines  These blood-thinning medicines are taken by mouth, usually for several weeks or longer. It is important to take the medicine every single day at the same time each day.  There are no regular blood tests required when using these medicines.  There are fewer food and drug interactions than with warfarin.  The side effects of this class of medicine are similar to those of warfarin, including excessive bruising or bleeding. Ask your health care provider or pharmacist about other potential side effects. SEEK MEDICAL CARE IF:  You notice a rapid heartbeat.  You feel weaker or more tired than usual.  You feel faint.  You notice increased bruising.  You feel your symptoms are not getting better in the time expected.  You believe you are having side effects of medicine. SEEK IMMEDIATE MEDICAL CARE IF:  You  have chest pain.  You have trouble breathing.  You have new or increased swelling or pain in one leg.  You cough up blood.  You notice blood in vomit, in a bowel movement, or in urine. MAKE SURE YOU:  Understand these instructions.  Will watch your condition.  Will get help right away if you are not doing well or get worse. Document Released: 08/26/2005 Document Revised: 01/10/2014 Document Reviewed: 05/03/2013 [facility] Patient Information 2015 [place], [place]. This information is not intended to replace advice given to you by your health care provider. Make sure you discuss any questions you have with your health care provider.   Gastroesophageal Reflux Disease, Adult Gastroesophageal reflux disease (GERD) happens when acid from your stomach flows up into the esophagus. When acid comes in contact with the esophagus, the acid causes soreness (inflammation) in the esophagus. Over time, GERD may create small holes (ulcers) in the lining of the esophagus. CAUSES   Increased body weight. This puts pressure on the stomach, making acid rise from the stomach into the esophagus.  Smoking. This increases acid production in the stomach.  Drinking alcohol. This causes decreased pressure in the lower esophageal sphincter (valve or ring of muscle between the esophagus and stomach), allowing acid from the stomach into the esophagus.  Late evening meals and a full stomach. This increases pressure and acid production in the stomach.  A malformed lower esophageal sphincter. Sometimes, no cause is found. SYMPTOMS   Burning pain in the lower part of the mid-chest behind the breastbone and in the mid-stomach area. This may occur twice a week or more often.  Trouble swallowing.  Sore throat.  Dry cough.  Asthma-like symptoms including chest tightness, shortness of breath, or wheezing. DIAGNOSIS  Your caregiver may be able to diagnose GERD based on your symptoms. In some cases, X-rays and  other tests may be done to check for complications or to check the condition of your stomach and esophagus. TREATMENT  Your caregiver may recommend over-the-counter or prescription medicines to help decrease acid production. Ask your caregiver before starting or adding any new medicines.  HOME CARE INSTRUCTIONS   Change the factors that you can control. Ask your caregiver for guidance concerning weight loss, quitting smoking, and alcohol consumption.  Avoid foods and drinks that make your symptoms worse, such  as:  Caffeine or alcoholic drinks.  Chocolate.  Peppermint or mint flavorings.  Garlic and onions.  Spicy foods.  Citrus fruits, such as oranges, lemons, or limes.  Tomato-based foods such as sauce, chili, salsa, and pizza.  Fried and fatty foods.  Avoid lying down for the 3 hours prior to your bedtime or prior to taking a nap.  Eat small, frequent meals instead of large meals.  Wear loose-fitting clothing. Do not wear anything tight around your waist that causes pressure on your stomach.  Raise the head of your bed 6 to 8 inches with wood blocks to help you sleep. Extra pillows will not help.  Only take over-the-counter or prescription medicines for pain, discomfort, or fever as directed by your caregiver.  Do not take aspirin, ibuprofen, or other nonsteroidal anti-inflammatory drugs (NSAIDs). SEEK IMMEDIATE MEDICAL CARE IF:   You have pain in your arms, neck, jaw, teeth, or back.  Your pain increases or changes in intensity or duration.  You develop nausea, vomiting, or sweating (diaphoresis).  You develop shortness of breath, or you faint.  Your vomit is green, yellow, black, or looks like coffee grounds or blood.  Your stool is red, bloody, or black. These symptoms could be signs of other problems, such as heart disease, gastric bleeding, or esophageal bleeding. MAKE SURE YOU:   Understand these instructions.  Will watch your condition.  Will get help  right away if you are not doing well or get worse. Document Released: 06/05/2005 Document Revised: 11/18/2011 Document Reviewed: 03/15/2011 [facility] Patient Information 2015 [place], [place]. This information is not intended to replace advice given to you by your health care provider. Make sure you discuss any questions you have with your health care provider.

## 2015-01-04 NOTE — Progress Notes (Signed)
Subjective:    Patient ID: Corky CraftsRochelle Beissel, female    DOB: 09/01/1951, 64 y.o.   MRN: [id number]  Chief Complaint  Patient presents with  . Leg Pain    right lower leg cramping up and tightning up and hurts behind the knee  . Nausea  . Emesis   There are no active problems to display for this patient.  Medications, allergies, past medical history, surgical history, family history, social history and problem list reviewed and updated.  HPI  [age] yof presents with right leg pain, nausea, and dyspepsia.   Right leg pain - first noticed cramping posterior calf one wk ago. Cramping has been intermittent. Has eased off past couple days. Throughout day when happens, not just at night. For past 2 days has had more aching back of knee. Fairly persistent. Thinks it feels swollen. Denies any warmth. Denies cp, sob, cough. Denies recent surgery. No prev clots. No fam hx clots. Flight to [place] 3 wks ago. No HRT.   Nausea/dyspepsia - Intermittent nausea past couple wks. Sometimes after meals. One episode last week of mild spitting up but no actual vomiting. Denies abd pain, diarrhea. Denies fevers, chills. No ruq pain. Mentions she has had heartburn after spicy meals for several yrs but recently past few wks-months has had heartburn after most meals. Has never taken anything for gerd. Does not use nsaids.   Review of Systems See HPI. No dysuria, hematuria. No palps.     Objective:   Physical Exam  Constitutional: She appears well-developed and well-nourished.  Non-toxic appearance. She does not have a sickly appearance. She does not appear ill. No distress.  BP 118/73 mmHg  Pulse 77  Temp(Src) 98.1 F (36.7 C) (Oral)  Resp 16  Ht 5\' 4"  (1.626 m)  Wt 164 lb 9.6 oz (74.662 kg)  BMI 28.24 kg/m2  SpO2 99%   Cardiovascular: Normal rate, regular rhythm and normal heart sounds.   Pulses:      Posterior tibial pulses are 2+ on the right side, and 2+ on the left side.  Pulmonary/Chest: Effort normal  and breath sounds normal. No tachypnea. She has no decreased breath sounds. She has no wheezes. She has no rhonchi. She has no rales.  Abdominal: Soft. Normal appearance and bowel sounds are normal. There is tenderness in the periumbilical area. There is no rigidity, no rebound, no guarding, no CVA tenderness, no tenderness at McBurney's point and negative Murphy's sign. Hernia confirmed negative in the ventral area.    Mild periumbilical ttp. No epigastric ttp.   Musculoskeletal:       Right lower leg: She exhibits tenderness and swelling.       Left lower leg: Normal.  Mild tenderness right popliteal fossa. Small palpable density right popliteal fossa. Right calf 14.5", left calf 14". No erythema or warmth RLE.   Normal strength RLE. Normal sensation. Normal cap refill. Normal patellar reflex. 2+ DP pulse.       Assessment & Plan:   [age] yof presents with right leg pain, nausea, and dyspepsia.   Swelling of right lower extremity - Plan: US Venous Img Lower Unilateral Left --concern for dvt with physical exam findings right popliteal fossa, plane trip 3 wks ago --referral for doppler this afternoon, scheduled --> will follow results --normal neuro, vascular exam RLE --if doppler neg --> checking cmp, tsh --discussed stretching, hydration, sleeping without covers on feet, ice prn for cramps  --rtc if no relief  Gastroesophageal reflux disease, esophagitis presence not specified -  Plan: H. pylori breath test, omeprazole (PRILOSEC) 40 MG capsule --h pylori breath test today --start omeprazole 40 qd --denies nsaid use --pt instructed to rtc 8 wks for f/u, likely attempt titration down at that time, discussed long term risks with ppis --rtc sooner if no relief with ppi  Nausea without vomiting - Plan: CBC, Comprehensive metabolic panel  Screening for thyroid disorder - Plan: TSH  Donnajean Lopes, PA-C Physician Assistant-Certified [facility]  01/04/2015 9:40 PM

## 2015-01-04 NOTE — Progress Notes (Signed)
Unilateral Lower Extremity Venous Duplex Exam - Right - Negative for DVT - Positive for > 1 minute of reflux of the GSV.

## 2015-01-04 NOTE — Telephone Encounter (Signed)
Pt states she need to stop by and pick up a note stating she can go back to work on Monday. Please call [phone]. Really would like to pick up today if possible

## 2015-01-05 LAB — H. PYLORI BREATH TEST: H. pylori Breath Test: NOT DETECTED

## 2015-01-05 NOTE — Telephone Encounter (Signed)
Patient called again regarding her note.  She wants to pick it up today so she can return to work on Monday,   [phone]

## 2015-01-05 NOTE — Telephone Encounter (Signed)
Called, informed pt of normal results of doppler study. She was already aware there was no clot. Will inform later when lab results come back.

## 2015-01-06 ENCOUNTER — Telehealth: Payer: Self-pay | Admitting: Physician Assistant

## 2015-01-06 NOTE — Telephone Encounter (Signed)
Created in error

## 2015-03-06 ENCOUNTER — Other Ambulatory Visit: Payer: Self-pay

## 2015-03-31 ENCOUNTER — Ambulatory Visit
Admission: RE | Admit: 2015-03-31 | Discharge: 2015-03-31 | Disposition: A | Discharge status: Home / Self Care | Payer: BC Managed Care – PPO | Source: Ambulatory Visit

## 2015-03-31 DIAGNOSIS — Z1231 Encounter for screening mammogram for malignant neoplasm of breast: Secondary | ICD-10-CM

## 2015-04-03 ENCOUNTER — Ambulatory Visit: Payer: BC Managed Care – PPO

## 2015-10-16 ENCOUNTER — Ambulatory Visit (INDEPENDENT_AMBULATORY_CARE_PROVIDER_SITE_OTHER): Payer: BC Managed Care – PPO | Admitting: Physician Assistant

## 2015-10-16 VITALS — BP 124/90 | HR 96 | Temp 98.5°F | Resp 17 | Ht 64.25 in | Wt 166.8 lb

## 2015-10-16 DIAGNOSIS — H6123 Impacted cerumen, bilateral: Secondary | ICD-10-CM

## 2015-10-16 DIAGNOSIS — J0100 Acute maxillary sinusitis, unspecified: Secondary | ICD-10-CM

## 2015-10-16 MED ORDER — AMOXICILLIN-POT CLAVULANATE 875-125 MG PO TABS: 1.0000 | ORAL_TABLET | Freq: Two times a day (BID) | ORAL | Status: DC

## 2015-10-16 MED ORDER — GUAIFENESIN ER 1200 MG PO TB12: 1.0000 | ORAL_TABLET | Freq: Two times a day (BID) | ORAL | Status: DC | PRN

## 2015-10-16 NOTE — Progress Notes (Signed)
Subjective:    Patient ID: Rachael Powell, female    DOB: Nov 07, 1950, 65 y.o.   MRN: [id number]  Chief Complaint  Patient presents with  . Cough    X Tuesday  . Facial Pain    X Tuesday  . Sore Throat    X Tuesday    HPI Presents today with cough, facial pain and sore throat x 6 days. Symptoms started on Tuesday, 10/10/15 with rhinorrhea. On Wednesday, 10/11/15 she beceame congested with assoicated PND, and a "tickle cough". Thursday, 10/12/15, see noted a burning sensation in the back of her throat. Saturday burning sensation resolved, and hoarseness of her voice began. She explains this occurred before she went to her grandsons basketball game and was yelling.   Past two days she has noted fullness in her right ear and the cough has become productive with yellow phelgm. Tried herbal cough drops with no relief. Took Mucinex on 2 occasions yesterday with no relief. No recent illness prior. Admits to having similar illness last year but is not sure what she was given. Notes sick contact at work. Denies fever or hemoptysis.   Review of Systems  Constitutional: Negative for fever, chills, diaphoresis, appetite change and fatigue.  HENT: Positive for congestion, ear pain (right), postnasal drip, rhinorrhea and sinus pressure. Negative for ear discharge, hearing loss, sneezing, sore throat, tinnitus and trouble swallowing.   Eyes: Negative for pain, discharge, redness and itching.  Respiratory: Negative for cough, chest tightness and shortness of breath.   Cardiovascular: Negative for chest pain, palpitations and leg swelling.  Gastrointestinal: Negative for abdominal pain.  Musculoskeletal: Negative for myalgias and arthralgias.  Skin: Negative.   Neurological: Positive for headaches. Negative for weakness.       Objective:   Physical Exam  Constitutional: She appears well-developed and well-nourished. No distress.  BP 124/90 mmHg  Pulse 96  Temp(Src) 98.5 F (36.9 C) (Oral)  Resp  17  Ht 5' 4.25" (1.632 m)  Wt 166 lb 12.8 oz (75.66 kg)  BMI 28.41 kg/m2  SpO2 97%   HENT:  Head: Normocephalic and atraumatic.  Right Ear: Hearing and external ear normal.  Left Ear: Hearing and external ear normal.  Nose: Right sinus exhibits maxillary sinus tenderness. Right sinus exhibits no frontal sinus tenderness. Left sinus exhibits maxillary sinus tenderness. Left sinus exhibits no frontal sinus tenderness.  Mouth/Throat: Uvula is midline, oropharynx is clear and moist and mucous membranes are normal. No oropharyngeal exudate.  Impacted cerumn b/l.   Eyes: Conjunctivae, EOM and lids are normal. Right pupil is not reactive. Right pupil is round. Left pupil is round and reactive.  Neck: Trachea normal and normal range of motion. Neck supple. No rigidity. No tracheal deviation and normal range of motion present.  Cardiovascular: Normal rate, regular rhythm, S1 normal, S2 normal, normal heart sounds and normal pulses.  Exam reveals no gallop and no friction rub.   No murmur heard. Pulses:      Radial pulses are 2+ on the right side, and 2+ on the left side.  Pulmonary/Chest: Effort normal and breath sounds normal. Stridor: Hoarsness.  Lymphadenopathy:       Head (right side): Submandibular adenopathy present. No submental, no tonsillar, no preauricular, no posterior auricular and no occipital adenopathy present.       Head (left side): Submandibular adenopathy present. No submental, no tonsillar, no preauricular, no posterior auricular and no occipital adenopathy present.    She has no cervical adenopathy.  Right cervical: No superficial cervical, no deep cervical and no posterior cervical adenopathy present.      Left cervical: No deep cervical and no posterior cervical adenopathy present.  Neurological: She is alert.  Skin: Skin is warm and dry. No erythema.  Psychiatric: She has a normal mood and affect. Her behavior is normal.  Vitals reviewed.     Assessment & Plan:    1. Acute maxillary sinusitis, recurrence not specified - amoxicillin-clavulanate (AUGMENTIN) 875-125 MG tablet; Take 1 tablet by mouth 2 (two) times daily.  Dispense: 20 tablet; Refill: 0 - Guaifenesin (MUCINEX MAXIMUM STRENGTH) 1200 MG TB12; Take 1 tablet (1,200 mg total) by mouth every 12 (twelve) hours as needed.  Dispense: 14 tablet; Refill: 1  2. Cerumen impaction, bilateral Cerumen irrigation completed. Re-visualization showed clear canals on reinspection.   No Follow-up on file.

## 2015-10-16 NOTE — Patient Instructions (Signed)

## 2015-10-16 NOTE — Progress Notes (Signed)
Patient ID: Rachael Powell, female    DOB: [birth year], 65 y.o.   MRN: [id number]  PCP: Nicki Reaper, NP  Subjective:   Chief Complaint  Patient presents with  . Cough    X Tuesday  . Facial Pain    X Tuesday  . Sore Throat    X Tuesday    HPI Presents for evaluation of cough, facial pain and sore throat x 6 days. Symptoms started on Tuesday, 10/10/15 with rhinorrhea. On Wednesday, 10/11/15 she beceame congested with assoicated PND, and a "tickle cough". Thursday, 10/12/15, see noted a burning sensation in the back of her throat. Saturday burning sensation resolved, and hoarseness of her voice began. She explains this occurred before she went to her grandsons basketball game and was yelling.   Past two days she has noted fullness in her right ear and the cough has become productive with yellow phelgm. Tried herbal cough drops with no relief. Took Mucinex on 2 occasions yesterday with no relief. No recent illness prior. Admits to having similar illness last year but is not sure what she was given. Notes sick contact at work. Denies fever or hemoptysis. .   Review of Systems Constitutional: Negative for fever, chills, diaphoresis, appetite change and fatigue.  HENT: Positive for congestion, ear pain (right), postnasal drip, rhinorrhea and sinus pressure. Negative for ear discharge, hearing loss, sneezing, sore throat, tinnitus and trouble swallowing.  Eyes: Negative for pain, discharge, redness and itching.  Respiratory: Negative for cough, chest tightness and shortness of breath.  Cardiovascular: Negative for chest pain, palpitations and leg swelling.  Gastrointestinal: Negative for abdominal pain.  Musculoskeletal: Negative for myalgias and arthralgias.  Skin: Negative.  Neurological: Positive for headaches. Negative for weakness.    There are no active problems to display for this patient.   No Current medications.    No Known Allergies     Objective:  Physical Exam   Constitutional: She is oriented to person, place, and time. She appears well-developed and well-nourished. No distress.  BP 124/90 mmHg  Pulse 96  Temp(Src) 98.5 F (36.9 C) (Oral)  Resp 17  Ht 5' 4.25" (1.632 m)  Wt 166 lb 12.8 oz (75.66 kg)  BMI 28.41 kg/m2  SpO2 9%   HENT:  Head: Normocephalic and atraumatic.  Right Ear: Hearing, tympanic membrane and external ear normal. A foreign body is present.  Left Ear: Hearing, tympanic membrane and external ear normal. A foreign body is present.  Nose: Mucosal edema and rhinorrhea present.  No foreign bodies. Right sinus exhibits maxillary sinus tenderness. Right sinus exhibits no frontal sinus tenderness. Left sinus exhibits maxillary sinus tenderness. Left sinus exhibits no frontal sinus tenderness.  Mouth/Throat: Uvula is midline, oropharynx is clear and moist and mucous membranes are normal. No uvula swelling. No oropharyngeal exudate.  Both canals with cerumen impaction, cleared with irrigation.  Eyes: Conjunctivae and EOM are normal. Right eye exhibits no discharge. Left eye exhibits no discharge. No scleral icterus. 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  Neck: Trachea normal, normal range of motion, full passive range of motion without pain and phonation normal. Neck supple. No tracheal tenderness present. No tracheal deviation present. No thyroid mass and no thyromegaly present.  Cardiovascular: Normal rate, regular rhythm and normal heart sounds.   Pulmonary/Chest: Effort normal and breath sounds normal. No stridor.  Lymphadenopathy:       Head (right side): No submandibular, no tonsillar, no preauricular, no posterior auricular and no occipital adenopathy  present.       Head (left side): No submandibular, no tonsillar, no preauricular and no occipital adenopathy present.    She has no cervical adenopathy.       Right: No supraclavicular adenopathy present.       Left: No supraclavicular adenopathy present.    Neurological: She is alert and oriented to person, place, and time. She has normal strength. No cranial nerve deficit or sensory deficit.  Skin: Skin is warm, dry and intact. No rash noted.  Psychiatric: She has a normal mood and affect. Her speech is normal and behavior is normal.           Assessment & Plan:   1. Acute maxillary sinusitis, recurrence not specified Supportive care, including voice rest. Continue mucinex. ANticipatory guidance. - amoxicillin-clavulanate (AUGMENTIN) 875-125 MG tablet; Take 1 tablet by mouth 2 (two) times daily.  Dispense: 20 tablet; Refill: 0 - Guaifenesin (MUCINEX MAXIMUM STRENGTH) 1200 MG TB12; Take 1 tablet (1,200 mg total) by mouth every 12 (twelve) hours as needed.  Dispense: 14 tablet; Refill: 1  2. Cerumen impaction, bilateral Resolved with irrigation.   Fernande Bras, PA-C Physician Assistant-Certified [facility]

## 2015-10-17 ENCOUNTER — Telehealth: Payer: Self-pay

## 2015-10-17 NOTE — Telephone Encounter (Signed)
Pt saw chelle on Monday and was given a note to be out of work til Thursday, but pt is still vomitting and feels she needs to be out of work til Monday   Best  Number [phone]

## 2015-10-18 NOTE — Telephone Encounter (Signed)
Okay to extend note? 

## 2015-10-19 ENCOUNTER — Telehealth: Payer: Self-pay

## 2015-10-19 NOTE — Telephone Encounter (Signed)
Pt was checking on the status of her note. She said that the vomiting was from the symptoms of the antibiotic. She will be in tomorrow morning to pick up the note.  Please advise  [phone]

## 2015-10-19 NOTE — Telephone Encounter (Signed)
Fine to extend work note until Monday, however, Chelle did not mention vomiting in her note... Is this a new symptom? If so, she may want to come in for further evaluation.

## 2015-10-20 ENCOUNTER — Encounter: Payer: Self-pay | Admitting: *Deleted

## 2015-10-20 NOTE — Telephone Encounter (Signed)
Done

## 2015-10-20 NOTE — Telephone Encounter (Signed)
Note done and pt picked up in office today

## 2015-12-10 ENCOUNTER — Ambulatory Visit (HOSPITAL_COMMUNITY): Payer: Self-pay

## 2015-12-10 ENCOUNTER — Ambulatory Visit (INDEPENDENT_AMBULATORY_CARE_PROVIDER_SITE_OTHER): Payer: BC Managed Care – PPO | Admitting: Emergency Medicine

## 2015-12-10 ENCOUNTER — Emergency Department (HOSPITAL_COMMUNITY): Payer: BC Managed Care – PPO

## 2015-12-10 ENCOUNTER — Emergency Department (HOSPITAL_COMMUNITY)
Admission: EM | Admit: 2015-12-10 | Discharge: 2015-12-10 | Disposition: A | Discharge status: Home / Self Care | Payer: BC Managed Care – PPO | Attending: Emergency Medicine | Admitting: Emergency Medicine

## 2015-12-10 ENCOUNTER — Encounter (HOSPITAL_COMMUNITY): Payer: Self-pay | Admitting: Nurse Practitioner

## 2015-12-10 ENCOUNTER — Emergency Department: Payer: BC Managed Care – PPO

## 2015-12-10 VITALS — BP 126/80 | HR 105 | Temp 97.9°F | Resp 17 | Ht 64.0 in | Wt 171.0 lb

## 2015-12-10 DIAGNOSIS — Z8719 Personal history of other diseases of the digestive system: Secondary | ICD-10-CM | POA: Diagnosis not present

## 2015-12-10 DIAGNOSIS — R072 Precordial pain: Secondary | ICD-10-CM

## 2015-12-10 DIAGNOSIS — Z86010 Personal history of colonic polyps: Secondary | ICD-10-CM | POA: Diagnosis not present

## 2015-12-10 DIAGNOSIS — Z87442 Personal history of urinary calculi: Secondary | ICD-10-CM | POA: Diagnosis not present

## 2015-12-10 DIAGNOSIS — F419 Anxiety disorder, unspecified: Secondary | ICD-10-CM | POA: Insufficient documentation

## 2015-12-10 DIAGNOSIS — Z8619 Personal history of other infectious and parasitic diseases: Secondary | ICD-10-CM | POA: Diagnosis not present

## 2015-12-10 DIAGNOSIS — R112 Nausea with vomiting, unspecified: Secondary | ICD-10-CM | POA: Diagnosis not present

## 2015-12-10 DIAGNOSIS — N189 Chronic kidney disease, unspecified: Secondary | ICD-10-CM | POA: Diagnosis not present

## 2015-12-10 DIAGNOSIS — Z8639 Personal history of other endocrine, nutritional and metabolic disease: Secondary | ICD-10-CM | POA: Diagnosis not present

## 2015-12-10 DIAGNOSIS — R059 Cough, unspecified: Secondary | ICD-10-CM

## 2015-12-10 DIAGNOSIS — R05 Cough: Secondary | ICD-10-CM

## 2015-12-10 DIAGNOSIS — R079 Chest pain, unspecified: Secondary | ICD-10-CM | POA: Insufficient documentation

## 2015-12-10 DIAGNOSIS — J029 Acute pharyngitis, unspecified: Secondary | ICD-10-CM

## 2015-12-10 DIAGNOSIS — R1013 Epigastric pain: Secondary | ICD-10-CM | POA: Diagnosis not present

## 2015-12-10 DIAGNOSIS — K219 Gastro-esophageal reflux disease without esophagitis: Secondary | ICD-10-CM | POA: Diagnosis not present

## 2015-12-10 LAB — CBC WITH DIFFERENTIAL/PLATELET
BASOS PCT: 1 %
Basophils Absolute: 0 10*3/uL (ref 0.0–0.1)
Eosinophils Absolute: 0.1 10*3/uL (ref 0.0–0.7)
Eosinophils Relative: 2 %
HEMATOCRIT: 39.8 % (ref 36.0–46.0)
HEMOGLOBIN: 13.1 g/dL (ref 12.0–15.0)
Lymphocytes Relative: 35 %
Lymphs Abs: 2.3 10*3/uL (ref 0.7–4.0)
MCH: 28.3 pg (ref 26.0–34.0)
MCHC: 32.9 g/dL (ref 30.0–36.0)
MCV: 86 fL (ref 78.0–100.0)
Monocytes Absolute: 0.6 10*3/uL (ref 0.1–1.0)
Monocytes Relative: 9 %
NEUTROS ABS: 3.5 10*3/uL (ref 1.7–7.7)
NEUTROS PCT: 53 %
Platelets: 247 10*3/uL (ref 150–400)
RBC: 4.63 MIL/uL (ref 3.87–5.11)
RDW: 13.5 % (ref 11.5–15.5)
WBC: 6.6 10*3/uL (ref 4.0–10.5)

## 2015-12-10 LAB — COMPREHENSIVE METABOLIC PANEL
ALBUMIN: 3.6 g/dL (ref 3.5–5.0)
ALK PHOS: 62 U/L (ref 38–126)
ALT: 40 U/L (ref 14–54)
ANION GAP: 9 (ref 5–15)
AST: 27 U/L (ref 15–41)
BILIRUBIN TOTAL: 0.5 mg/dL (ref 0.3–1.2)
BUN: 10 mg/dL (ref 6–20)
CALCIUM: 8.7 mg/dL — AB (ref 8.9–10.3)
CO2: 23 mmol/L (ref 22–32)
Chloride: 107 mmol/L (ref 101–111)
Creatinine, Ser: 0.85 mg/dL (ref 0.44–1.00)
GFR calc Af Amer: >60 mL/min (ref >60)
Glucose, Bld: 129 mg/dL — ABNORMAL HIGH (ref 65–99)
Potassium: 3.8 mmol/L (ref 3.5–5.1)
Sodium: 139 mmol/L (ref 135–145)
TOTAL PROTEIN: 6.9 g/dL (ref 6.5–8.1)

## 2015-12-10 LAB — I-STAT TROPONIN, ED
TROPONIN I, POC: 0 ng/mL (ref 0.00–0.08)
Troponin i, poc: 0 ng/mL (ref 0.00–0.08)

## 2015-12-10 LAB — LIPASE, BLOOD: Lipase: 21 U/L (ref 11–51)

## 2015-12-10 LAB — POCT RAPID STREP A (OFFICE): Rapid Strep A Screen: NEGATIVE

## 2015-12-10 MED ORDER — ASPIRIN 81 MG PO CHEW
324.0000 mg | CHEWABLE_TABLET | Freq: Once | ORAL | Status: AC
  Administered 2015-12-10: 324 mg via ORAL

## 2015-12-10 MED ORDER — GI COCKTAIL ~~LOC~~
30.0000 mL | Freq: Once | ORAL | Status: AC
  Administered 2015-12-10: 30 mL via ORAL
  Filled 2015-12-10: qty 30

## 2015-12-10 MED ORDER — PANTOPRAZOLE SODIUM 40 MG IV SOLR
40.0000 mg | Freq: Once | INTRAVENOUS | Status: AC
  Administered 2015-12-10: 40 mg via INTRAVENOUS
  Filled 2015-12-10: qty 40

## 2015-12-10 MED ORDER — ESOMEPRAZOLE MAGNESIUM 40 MG PO CPDR: 40.0000 mg | DELAYED_RELEASE_CAPSULE | Freq: Every day | ORAL | Status: DC

## 2015-12-10 MED ORDER — FAMOTIDINE IN NACL 20-0.9 MG/50ML-% IV SOLN
20.0000 mg | Freq: Once | INTRAVENOUS | Status: AC
  Administered 2015-12-10: 20 mg via INTRAVENOUS
  Filled 2015-12-10: qty 50

## 2015-12-10 MED ORDER — FAMOTIDINE 20 MG PO TABS
20.0000 mg | ORAL_TABLET | Freq: Two times a day (BID) | ORAL | Status: DC | PRN
Start: 2015-12-10 — End: 2016-02-01

## 2015-12-10 MED ORDER — SODIUM CHLORIDE 0.9 % IV BOLUS (SEPSIS)
1000.0000 mL | Freq: Once | INTRAVENOUS | Status: AC
  Administered 2015-12-10: 1000 mL via INTRAVENOUS

## 2015-12-10 MED ORDER — ONDANSETRON 4 MG PO TBDP: ORAL_TABLET | ORAL | Status: DC

## 2015-12-10 MED ORDER — ONDANSETRON HCL 4 MG/2ML IJ SOLN
4.0000 mg | Freq: Once | INTRAMUSCULAR | Status: AC
  Administered 2015-12-10: 4 mg via INTRAVENOUS
  Filled 2015-12-10: qty 2

## 2015-12-10 NOTE — Progress Notes (Signed)
Patient ID: Rachael Powell, female   DOB: 04/14/1951, 65 y.o.   MRN: [id number]     By signing my name below, I, Littie Deedsichard Sun, attest that this documentation has been prepared under the direction and in the presence of Lesle ChrisSteven Maximum Reiland, MD.  Electronically Signed: Littie Deedsichard Sun, Medical Scribe. 12/10/2015. 10:08 AM.   Chief Complaint:  Chief Complaint  Patient presents with  . Cough  . Emesis  . URI  . Nasal Congestion  . Abdominal Pain  . Sore Throat    HPI: Rachael Powell is a 65 y.o. female with a history of GERD [facility] complaining of gradual onset, sharp, LUQ abdominal pain that started a few days ago. Around 2:00 AM this morning, she began having productive cough with yellow sputum, burning sore throat, nasal congestion, chills, chest heaviness, belching, and wheezing when laying down. Patient feels like there is a weight on her chest. She also vomited early this morning. She feels like she is having heartburn in her throat with everything she eats. She has not tried anything for her symptoms. Patient denies myalgias and fever, although she states she does not normally run fevers. She also denies history of asthma. Patient is a former smoker but quit years ago. 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   Past Medical History  Diagnosis Date  . Chicken pox   . Thyroid disease   . Colon polyps   . GERD (gastroesophageal reflux disease)   . Frequent headaches   . Hypothyroidism     possible low thyroid. No meds  . Depression     no medication  . Chronic kidney disease     right renal stone   Past Surgical History  Procedure Laterality Date  . Dilation and curettage of uterus    . Hammer toe surgery    . Tubal ligation     Social History   Social History  . Marital Status: Divorced    Spouse Name: N/A  . Number of Children: N/A  . Years of Education: N/A   Social History Main Topics  . Smoking status:  Never Smoker   . Smokeless tobacco: None  . Alcohol Use: No  . Drug Use: No  . Sexual Activity: Yes   Other Topics Concern  . None   Social History Narrative   Family History  Problem Relation Age of Onset  . Diabetes Mother   . Diabetes Father    No Known Allergies Prior to Admission medications   Not on File     ROS: The patient denies fevers, night sweats, unintentional weight loss, palpitations, dyspnea on exertion, dysuria, hematuria, melena, numbness, weakness, or tingling.   All other systems have been reviewed and were otherwise negative with the exception of those mentioned in the HPI and as above.    PHYSICAL EXAM: Filed Vitals:   12/10/15 1002  BP: 126/80  Pulse: 105  Temp: 97.9 F (36.6 C)  Resp: 17   Body mass index is 29.34 kg/(m^2).   General: Alert, no acute distress HEENT:  Normocephalic, atraumatic, oropharynx patent. Throat not red. [facility] Cardiovascular:  Regular rate and rhythm, no rubs murmurs or gallops.  No Carotid bruits, radial pulse intact. No pedal edema.  Respiratory: No wheezes or rhonchi on left side of chest. Abdominal: Soft with mild left upper abdominal discomfort. Musculoskeletal: Gait intact. No edema, tenderness Skin: No rashes. Neurologic: Facial musculature symmetric. Psychiatric:  Patient acts appropriately throughout our interaction. Lymphatic: No cervical or submandibular lymphadenopathy      LABS:    EKG/XRAY:   Primary read interpreted by Dr. Cleta Alberts at [facility]. There is a QS pattern D2 there is 1-2 mm ST elevation in V2 as well as 1 mm elevation in V1. This possibly could be due to myocardial region. Patient referred to the hospital for their evaluation.   ASSESSMENT/PLAN: Patient presents with what appeared to be in illness. Her EKG is abnormal showing ST elevation in V1 and V2. Patient will be given 4 baby aspirin and sent to hospital for their evaluation and patient will be evaluated in the emergency  room rule out acute cardiac injury.. She was given 4 baby aspirin prior to transport.I personally performed the services described in this documentation, which was scribed in my presence. The recorded information has been reviewed and is accurate.    Gross sideeffects, risk and benefits, and alternatives of medications d/w patient. Patient is aware that all medications have potential sideeffects and we are unable to predict every sideeffect or drug-drug interaction that may occur.  Lesle Chris MD 12/10/2015 10:08 AM

## 2015-12-10 NOTE — ED Provider Notes (Signed)
CSN: [id number]     Arrival date & time 12/10/15  1123 History   First MD Initiated Contact with Patient 12/10/15 1128     Chief Complaint  Patient presents with  . Chest Pain     (Consider location/radiation/quality/duration/timing/severity/associated sxs/prior Treatment) The history is provided by the patient.  Rachael Powell is a 65 y.o. female hx of GERD, hypothyroidism, CKD, here with cough, vomiting, sore throat, chest pain. Vomited several times today then had sore throat. Also has chest pain and epigastric abdominal pain. Patient has no history of PE or CAD. Patient went to urgent care and had an abnormal EKG so sent here for evaluation. Patient was given aspirin, nitroglycerin with minimal relief. Patient also had rapid strep done there that was negative.      Past Medical History  Diagnosis Date  . Chicken pox   . Thyroid disease   . Colon polyps   . GERD (gastroesophageal reflux disease)   . Frequent headaches   . Hypothyroidism     possible low thyroid. No meds  . Depression     no medication  . Chronic kidney disease     right renal stone   Past Surgical History  Procedure Laterality Date  . Dilation and curettage of uterus    . Hammer toe surgery    . Tubal ligation     Family History  Problem Relation Age of Onset  . Diabetes Mother   . Diabetes Father    Social History  Substance Use Topics  . Smoking status: Never Smoker   . Smokeless tobacco: None  . Alcohol Use: No   OB History    No data available     Review of Systems  Cardiovascular: Positive for chest pain.  All other systems reviewed and are negative.     Allergies  Review of patient's allergies indicates no known allergies.  Home Medications   Prior to Admission medications   Medication Sig Start Date End Date Taking? Authorizing Provider  esomeprazole (NEXIUM) 40 MG capsule Take 1 capsule (40 mg total) by mouth daily. 12/10/15   Rachael Canal, MD  famotidine (PEPCID) 20 MG  tablet Take 1 tablet (20 mg total) by mouth 2 (two) times daily as needed for heartburn or indigestion. 12/10/15   Rachael Canal, MD  ondansetron (ZOFRAN ODT) 4 MG disintegrating tablet  ODT q4 hours prn nausea/vomit 12/10/15   Rachael Canal, MD   BP 121/79 mmHg  Pulse 65  Temp(Src) 98.1 F (36.7 C) (Oral)  Resp 16  Ht  (1.626 m)  Wt 171 lb (77.565 kg)  BMI 29.34 kg/m2  SpO2 96% Physical Exam  Constitutional: She is oriented to person, place, and time.  Slightly anxious   HENT:  Head: Normocephalic.  OP slightly red, no tonsillar exudates   Eyes: Conjunctivae are normal. Pupils are equal, round, and reactive to light.  Neck: Normal range of motion. Neck supple.  Cardiovascular: Normal rate, regular rhythm and normal heart sounds.   Pulmonary/Chest: Effort normal and breath sounds normal. No respiratory distress. She has no wheezes. She has no rales.  Abdominal: Soft. Bowel sounds are normal. She exhibits no distension. There is no tenderness. There is no rebound.  Musculoskeletal: Normal range of motion. She exhibits no edema or tenderness.  Neurological: She is alert and oriented to person, place, and time. No cranial nerve deficit. Coordination normal.  Skin: Skin is warm and dry.  Psychiatric: She has a normal mood and  affect. Her behavior is normal. Judgment and thought content normal.  Nursing note and vitals reviewed.   ED Course  Procedures (including critical care time) Labs Review Labs Reviewed  COMPREHENSIVE METABOLIC PANEL - Abnormal; Notable for the following:    Glucose, Bld 129 (*)    Calcium 8.7 (*)    All other components within normal limits  CBC WITH DIFFERENTIAL/PLATELET  LIPASE, BLOOD  I-STAT TROPOININ, ED  Rosezena SensorI-STAT TROPOININ, ED    Imaging Review Dg Chest 2 View  12/10/2015  CLINICAL DATA:  Coughing and vomiting starting today. Central not radiating chest pain. EXAM: CHEST  2 VIEW COMPARISON:  Chest x-ray dated 10/26/2003. FINDINGS: The heart size and  mediastinal contours are within normal limits. Both lungs are clear. Lungs appear at least mildly hyperexpanded suggesting COPD. The visualized skeletal structures are unremarkable. IMPRESSION: 1. 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 2. No acute findings. No evidence of pneumonia. Heart size is normal. Electronically Signed   By: Bary RichardStan  Maynard M.D.   On: 12/10/2015 13:06   I have personally reviewed and evaluated these images and lab results as part of my medical decision-making.   EKG Interpretation   Date/Time:  Sunday December 10 2015 11:32:43 EDT Ventricular Rate:  71 PR Interval:  195 QRS Duration: 115 QT Interval:  420 QTC Calculation: 456 R Axis:   36 Text Interpretation:  Sinus rhythm Nonspecific intraventricular conduction  delay No previous ECGs available Confirmed by Rachael Croker  MD, Dartanyon Frankowski ([id number]) on  12/10/2015 11:56:28 AM       MDM   Final diagnoses:  Non-intractable vomiting with nausea, vomiting of unspecified type  Sore throat   Rachael Powell is a 65 y.o. female here with cough, vomiting, sore throat. Rapid strep neg at urgent care. Likely some reflux or irritation of the throat from vomiting. Reviewed EKG from urgent care and EMS and I don't see any STEMI. Repeat EKG showed no STEMI in the ED. No hx of CAD. No recent travel and I doubt PE or dissection. Likely reflux. Will get labs, delta trop, CXR.   4:43 PM Given GI cocktail, pepcid, protonix, felt better. CXR clear. Delta trop neg. Likely GERD or throat irritation from vomiting. Will dc home with pepcid, nexium, prn zofran.      Rachael Canalavid H Cortina Vultaggio, MD 12/10/15 [phone]

## 2015-12-10 NOTE — ED Notes (Signed)
Pt stable, ambulatory, states understanding of discharge instructions 

## 2015-12-10 NOTE — Discharge Instructions (Signed)
Stay hydrated.   Take pepcid twice daily as needed.   Take nexium daily.   Take zofran as needed for nausea.   See your doctor. You may consider endoscopy if you have persistent acid taste in your mouth or sore throat or throat irritation   Return to ER if you have any chest pain, trouble breathing, vomiting, trouble swallowing.

## 2015-12-10 NOTE — ED Notes (Signed)
[facility] central non-radiating CP. EKG showed some ST elevation in V2. Patient given 324 ASA and 1 nitro with minimal relief. Patient chest pressure 4/10, denies nausea at present time.

## 2015-12-10 NOTE — Patient Instructions (Signed)
     IF you received an x-ray today, you will receive an invoice from [facility]. Please contact [facility] at [phone] with questions or concerns regarding your invoice.   IF you received labwork today, you will receive an invoice from [facility]. Please contact [facility] at [phone] with questions or concerns regarding your invoice.   Our billing staff will not be able to assist you with questions regarding bills from these companies.  You will be contacted with the lab results as soon as they are available. The fastest way to get your results is to activate your My Chart account. Instructions are located on the last page of this paperwork. If you have not heard from us regarding the results in 2 weeks, please contact this office.      

## 2016-02-01 ENCOUNTER — Ambulatory Visit (INDEPENDENT_AMBULATORY_CARE_PROVIDER_SITE_OTHER): Payer: BC Managed Care – PPO | Admitting: Urgent Care

## 2016-02-01 VITALS — BP 112/84 | HR 75 | Temp 97.4°F | Resp 16 | Ht 64.25 in | Wt 168.8 lb

## 2016-02-01 DIAGNOSIS — S8002XA Contusion of left knee, initial encounter: Secondary | ICD-10-CM

## 2016-02-01 DIAGNOSIS — W101XXA Fall (on)(from) sidewalk curb, initial encounter: Secondary | ICD-10-CM

## 2016-02-01 DIAGNOSIS — M62838 Other muscle spasm: Secondary | ICD-10-CM

## 2016-02-01 DIAGNOSIS — M25512 Pain in left shoulder: Secondary | ICD-10-CM

## 2016-02-01 MED ORDER — NAPROXEN SODIUM 550 MG PO TABS: 550.0000 mg | ORAL_TABLET | Freq: Two times a day (BID) | ORAL | Status: DC

## 2016-02-01 MED ORDER — CYCLOBENZAPRINE HCL ER 15 MG PO CP24: 15.0000 mg | ORAL_CAPSULE | Freq: Every day | ORAL | Status: DC | PRN

## 2016-02-01 NOTE — Patient Instructions (Addendum)
Contusion A contusion is a deep bruise. Contusions are the result of a blunt injury to tissues and muscle fibers under the skin. The injury causes bleeding under the skin. The skin overlying the contusion may turn blue, purple, or yellow. Minor injuries will give you a painless contusion, but more severe contusions may stay painful and swollen for a few weeks.  CAUSES  This condition is usually caused by a blow, trauma, or direct force to an area of the body. SYMPTOMS  Symptoms of this condition include:  Swelling of the injured area.  Pain and tenderness in the injured area.  Discoloration. The area may have redness and then turn blue, purple, or yellow. DIAGNOSIS  This condition is diagnosed based on a physical exam and medical history. An X-ray, CT scan, or MRI may be needed to determine if there are any associated injuries, such as broken bones (fractures). TREATMENT  Specific treatment for this condition depends on what area of the body was injured. In general, the best treatment for a contusion is resting, icing, applying pressure to (compression), and elevating the injured area. This is often called the RICE strategy. Over-the-counter anti-inflammatory medicines may also be recommended for pain control.  HOME CARE INSTRUCTIONS   Rest the injured area.  If directed, apply ice to the injured area:  Put ice in a plastic bag.  Place a towel between your skin and the bag.  Leave the ice on for 20 minutes, 2-3 times per day.  If directed, apply light compression to the injured area using an elastic bandage. Make sure the bandage is not wrapped too tightly. Remove and reapply the bandage as directed by your health care provider.  If possible, raise (elevate) the injured area above the level of your heart while you are sitting or lying down.  Take over-the-counter and prescription medicines only as told by your health care provider. SEEK MEDICAL CARE IF:  Your symptoms do not  improve after several days of treatment.  Your symptoms get worse.  You have difficulty moving the injured area. SEEK IMMEDIATE MEDICAL CARE IF:   You have severe pain.  You have numbness in a hand or foot.  Your hand or foot turns pale or cold.   This information is not intended to replace advice given to you by your health care provider. Make sure you discuss any questions you have with your health care provider.   Document Released: 06/05/2005 Document Revised: 05/17/2015 Document Reviewed: 01/11/2015 Elsevier Interactive Patient Education 2016 [employer]    IF you received an x-ray today, you will receive an invoice from [facility]. Please contact  Radiology at [phone] with questions or concerns regarding your invoice.   IF you received labwork today, you will receive an invoice from [facility]. Please contact [facility] at [phone] with questions or concerns regarding your invoice.   Our billing staff will not be able to assist you with questions regarding bills from these companies.  You will be contacted with the lab results as soon as they are available. The fastest way to get your results is to activate your My Chart account. Instructions are located on the last page of this paperwork. If you have not heard from us regarding the results in 2 weeks, please contact this office.      

## 2016-02-01 NOTE — Progress Notes (Signed)
    MRN: [id number] DOB: 09/15/1950  Subjective:   Rachael Powell is a 65 y.o. female presenting for chief complaint of Shoulder Pain and Knee Pain  Reports suffering a fall 4 days ago, rolled her right ankle over the curb of a sidewalk. She broke her fall with her left knee and also made impact with her left shoulder. 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 She has continued to push through her pain but yesterday, the pain became severe enough for her to present to clinic. The pain is intermittent, has a pressure type sensation, associated with swelling. She has used a heating pad, Epsom salts, elevated her leg.   Rachael Powell has a current medication list which includes the following prescription(s): esomeprazole. Also has No Known Allergies.  Rachael Powell  has a past medical history of Chicken pox; Thyroid disease; Colon polyps; GERD (gastroesophageal reflux disease); Frequent headaches; Hypothyroidism; Depression; and Chronic kidney disease. Also  has past surgical history that includes Dilation and curettage of uterus; Hammer toe surgery; and Tubal ligation.  Objective:   Vitals: BP 112/84 mmHg  Pulse 75  Temp(Src) 97.4 F (36.3 C) (Oral)  Resp 16  Ht 5' 4.25" (1.632 m)  Wt 168 lb 12.8 oz (76.567 kg)  BMI 28.75 kg/m2  SpO2 98%  Physical Exam  Constitutional: She is oriented to person, place, and time. She appears well-developed and well-nourished.  Cardiovascular: Normal rate.   Pulmonary/Chest: Effort normal.  Musculoskeletal:       Left shoulder: She exhibits decreased range of motion (abduction past ~120 degrees), tenderness (over AC joint) and spasm (left trapezius). She exhibits no swelling, no effusion, no crepitus, no deformity, no laceration and normal strength.       Left knee: She exhibits swelling (trace edema). She exhibits normal range of motion (albeit has pain with ROM testing), no ecchymosis, no deformity, no laceration, no erythema, normal  alignment, normal patellar mobility and no bony tenderness. Tenderness found. Medial joint line and patellar tendon tenderness noted. No lateral joint line, no MCL and no LCL tenderness noted.       Lumbar back: She exhibits tenderness (over area outlined) and spasm (over left trapezius). She exhibits normal range of motion (pain with flexion but full ROM), no swelling, no edema, no deformity and no laceration.       Back:  Neurological: She is alert and oriented to person, place, and time. She has normal reflexes.  Skin: Skin is warm and dry.   Assessment and Plan :   1. Fall involving sidewalk curb, initial encounter 2. Knee contusion, left, initial encounter 3. Left shoulder pain 4. Muscle spasm - Will manage conservatively with NSAID, Amrix. Recheck in 2 weeks if no improvement. Consider x-rays at that point, physical therapy. Patient agreed.  Wallis BambergMario Kaniah Rizzolo, PA-C [facility] [phone] 02/01/2016 8:24 AM

## 2016-07-10 ENCOUNTER — Other Ambulatory Visit: Payer: Self-pay | Admitting: Family Medicine

## 2016-07-10 DIAGNOSIS — Z1231 Encounter for screening mammogram for malignant neoplasm of breast: Secondary | ICD-10-CM

## 2016-07-24 ENCOUNTER — Ambulatory Visit
Admission: RE | Admit: 2016-07-24 | Discharge: 2016-07-24 | Disposition: A | Discharge status: Home / Self Care | Payer: BC Managed Care – PPO | Source: Ambulatory Visit | Attending: Family Medicine | Admitting: Family Medicine

## 2016-07-24 DIAGNOSIS — Z1231 Encounter for screening mammogram for malignant neoplasm of breast: Secondary | ICD-10-CM

## 2016-08-23 ENCOUNTER — Ambulatory Visit: Payer: BC Managed Care – PPO

## 2017-05-12 ENCOUNTER — Other Ambulatory Visit: Payer: Self-pay

## 2017-05-12 ENCOUNTER — Encounter (HOSPITAL_COMMUNITY): Payer: Self-pay | Admitting: Emergency Medicine

## 2017-05-12 ENCOUNTER — Emergency Department (HOSPITAL_COMMUNITY): Payer: BC Managed Care – PPO

## 2017-05-12 ENCOUNTER — Emergency Department (HOSPITAL_COMMUNITY)
Admission: EM | Admit: 2017-05-12 | Discharge: 2017-05-12 | Disposition: A | Discharge status: Home / Self Care | Payer: BC Managed Care – PPO | Attending: Emergency Medicine | Admitting: Emergency Medicine

## 2017-05-12 DIAGNOSIS — G43709 Chronic migraine without aura, not intractable, without status migrainosus: Secondary | ICD-10-CM | POA: Diagnosis not present

## 2017-05-12 DIAGNOSIS — H539 Unspecified visual disturbance: Secondary | ICD-10-CM | POA: Diagnosis not present

## 2017-05-12 DIAGNOSIS — R51 Headache: Secondary | ICD-10-CM | POA: Diagnosis present

## 2017-05-12 DIAGNOSIS — N189 Chronic kidney disease, unspecified: Secondary | ICD-10-CM | POA: Diagnosis not present

## 2017-05-12 DIAGNOSIS — R6884 Jaw pain: Secondary | ICD-10-CM | POA: Diagnosis not present

## 2017-05-12 DIAGNOSIS — M79602 Pain in left arm: Secondary | ICD-10-CM | POA: Diagnosis not present

## 2017-05-12 DIAGNOSIS — G8929 Other chronic pain: Secondary | ICD-10-CM | POA: Diagnosis not present

## 2017-05-12 DIAGNOSIS — E039 Hypothyroidism, unspecified: Secondary | ICD-10-CM | POA: Diagnosis not present

## 2017-05-12 LAB — BASIC METABOLIC PANEL
ANION GAP: 9 (ref 5–15)
BUN: 10 mg/dL (ref 6–20)
CALCIUM: 9.5 mg/dL (ref 8.9–10.3)
CO2: 25 mmol/L (ref 22–32)
Chloride: 105 mmol/L (ref 101–111)
Creatinine, Ser: 0.97 mg/dL (ref 0.44–1.00)
GFR, EST NON AFRICAN AMERICAN: 59 mL/min — AB (ref >60)
Glucose, Bld: 99 mg/dL (ref 65–99)
Potassium: 3.9 mmol/L (ref 3.5–5.1)
Sodium: 139 mmol/L (ref 135–145)

## 2017-05-12 LAB — CBC
HCT: 42.5 % (ref 36.0–46.0)
HEMOGLOBIN: 14 g/dL (ref 12.0–15.0)
MCH: 28.1 pg (ref 26.0–34.0)
MCHC: 32.9 g/dL (ref 30.0–36.0)
MCV: 85.2 fL (ref 78.0–100.0)
PLATELETS: 285 10*3/uL (ref 150–400)
RBC: 4.99 MIL/uL (ref 3.87–5.11)
RDW: 12.9 % (ref 11.5–15.5)
WBC: 5.8 10*3/uL (ref 4.0–10.5)

## 2017-05-12 LAB — I-STAT TROPONIN, ED: TROPONIN I, POC: 0 ng/mL (ref 0.00–0.08)

## 2017-05-12 NOTE — Discharge Instructions (Signed)
Please call neurology office for further evaluation.  Your head ct does not show any acute abnormality.  Return if headache is worsening, your are weak on one side, or develop fever.

## 2017-05-12 NOTE — ED Notes (Signed)
Pt reports having severe pain and requesting to see MD. Dr. Shela CommonsJ made aware

## 2017-05-12 NOTE — ED Triage Notes (Signed)
Pt states she has an ongoing headache for several days and left arm sharp pains. Pt Denies CP/SOB. Pt also states her jaw hurst on the left side and into her neck. Pt started stumbling when she was walking yesterday. Pt is neuro intact, VAN neg. Alert and oriented.

## 2017-05-12 NOTE — ED Notes (Signed)
Patient transported to CT 

## 2017-05-12 NOTE — ED Provider Notes (Signed)
66 year old female with headache signed out to me by Dr. Rennis ChrisJacobowitz. Head CT was pending and I have reviewed this and it shows no acute intracranial abnormalities. Breasts of her labs and vital signs reviewed. I discussed the results with the patient and her family. I provided a referral to neurology. We have discussed return precautions and need for follow-up and she and her family voice understanding   Margarita Grizzleay, Kristyl Athens, MD 05/12/17 [phone]

## 2017-05-12 NOTE — ED Provider Notes (Signed)
[facility]-EMERGENCY DEPT Provider Note   CSN: [id number] Arrival date & time: 05/12/17  1201     History   Chief Complaint Chief Complaint  Patient presents with  . Headache  . Jaw Pain    HPI Rachael Powell is a 66 y.o. female.complains of left-sided headache and left jaw pain intermittentfor 6 months, pain has become worse in the past 2 days and has been constant since yesterday. She also complains of left arm pain which is worse with moving her left arm over the past 6 months. Arm pain does not come at the same time as her headache.she denies any chest pain denies shortness of breath denies nausea or vomiting. No visual changes.she does complain of mild neck pain No treatment prior to coming here no other associated symptoms  HPI  Past Medical History:  Diagnosis Date  . Chicken pox   . Chronic kidney disease    right renal stone  . Colon polyps   . Depression    no medication  . Frequent headaches   . GERD (gastroesophageal reflux disease)   . Hypothyroidism    possible low thyroid. No meds  . Thyroid disease     There are no active problems to display for this patient.   Past Surgical History:  Procedure Laterality Date  . DILATION AND CURETTAGE OF UTERUS    . HAMMER TOE SURGERY    . TUBAL LIGATION      OB History    No data available       Home Medications    Prior to Admission medications   Medication Sig Start Date End Date Taking? Authorizing Provider  cyclobenzaprine (AMRIX) 15 MG 24 hr capsule Take 1 capsule (15 mg total) by mouth daily as needed for muscle spasms. Patient not taking: Reported on 05/12/2017 02/01/16   Wallis Bamberg, PA-C  esomeprazole (NEXIUM) 40 MG capsule Take 1 capsule (40 mg total) by mouth daily. Patient not taking: Reported on 05/12/2017 12/10/15   Charlynne Pander, MD  naproxen sodium (ANAPROX DS) 550 MG tablet Take 1 tablet (550 mg total) by mouth 2 (two) times daily with a meal. Patient not taking: Reported on 05/12/2017 02/01/16    Wallis Bamberg, PA-C    Family History Family History  Problem Relation Age of Onset  . Diabetes Mother   . Diabetes Father     Social History Social History  Substance Use Topics  . Smoking status: Never Smoker  . Smokeless tobacco: Never Used  . Alcohol use No     Allergies   Patient has no known allergies.   Review of Systems Review of Systems  Constitutional: Negative.   HENT: Negative.        Left jaw pain  Eyes: Positive for visual disturbance.       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   Cardiovascular: Negative.   Gastrointestinal: Negative.   Musculoskeletal: Positive for myalgias and neck pain.       Left arm pain  Skin: Negative.   Neurological: Positive for headaches.  Psychiatric/Behavioral: Negative.   All other systems reviewed and are negative.    Physical Exam Updated Vital Signs BP (!) 142/91   Pulse 69   Temp 97.8 F (36.6 C) (Oral)   Resp (!) 25   Ht 5\' 4"  (1.626 m)   Wt 74.4 kg (164 lb)   SpO2 100%   BMI 28.15 kg/m   Physical Exam  Constitutional: She is oriented to  person, place, and time. She appears well-developed and well-nourished.  No facial asymmetry  HENT:  Head: Normocephalic and atraumatic.  Eyes: Conjunctivae and EOM are normal.  Neck: Neck supple. No tracheal deviation present. No thyromegaly present.  No bruit  Cardiovascular: Normal rate and regular rhythm.   No murmur heard. Pulmonary/Chest: Effort normal and breath sounds normal.  Abdominal: Soft. Bowel sounds are normal. She exhibits no distension. There is no tenderness.  Musculoskeletal: Normal range of motion. She exhibits no edema or tenderness.  Neurological: She is alert and oriented to person, place, and time. Coordination normal.  gait normal Romberg normal pronator drift normal finger to nose normal DTR symmetric bilaterally at knee jerk ankle jerk biceps historical and bilaterally  Skin: Skin is warm and dry. No rash noted.    Psychiatric: She has a normal mood and affect.  Nursing note and vitals reviewed.    ED Treatments / Results  Labs (all labs ordered are listed, but only abnormal results are displayed) Labs Reviewed  BASIC METABOLIC PANEL - Abnormal; Notable for the following:       Result Value   GFR calc non Af Amer 59 (*)    All other components within normal limits  CBC  I-STAT TROPONIN, ED    EKG  EKG Interpretation  Date/Time:  Monday May 12 2017 12:10:05 EDT Ventricular Rate:  85 PR Interval:  210 QRS Duration: 68 QT Interval:  376 QTC Calculation: 447 R Axis:   39 Text Interpretation:  Sinus rhythm with sinus arrhythmia with 1st degree A-V block with occasional Premature ventricular complexes Septal infarct , age undetermined Abnormal ECG No significant change since last tracing Confirmed by Doug SouJacubowitz, Makhya Arave [phone]([id number]) on 05/12/2017 3:52:54 PM       Radiology Dg Chest 2 View  Result Date: 05/12/2017 CLINICAL DATA:  Ongoing headache for several days.  Left arm pain. EXAM: CHEST  2 VIEW COMPARISON:  12/10/2015. FINDINGS: Normal sized heart. Clear lungs. The lungs are mildly hyperexpanded. Unremarkable bones. IMPRESSION: No acute abnormality.  Stable mild changes of COPD. Electronically Signed   By: Beckie SaltsSteven  Reid M.D.   On: 05/12/2017 12:53    Procedures Procedures (including critical care time)  Medications Ordered in ED Medications - No data to display Declines pain medicine Results for orders placed or performed during the hospital encounter of 05/12/17  Basic metabolic panel  Result Value Ref Range   Sodium 139 135 - 145 mmol/L   Potassium 3.9 3.5 - 5.1 mmol/L   Chloride 105 101 - 111 mmol/L   CO2 25 22 - 32 mmol/L   Glucose, Bld 99 65 - 99 mg/dL   BUN 10 6 - 20 mg/dL   Creatinine, Ser 5.280.97 0.44 - 1.00 mg/dL   Calcium 9.5 8.9 - 41.310.3 mg/dL   GFR calc non Af Amer 59 (L) >60 mL/min   GFR calc Af Amer >60 >60 mL/min   Anion gap 9 5 - 15  CBC  Result Value Ref Range    WBC 5.8 4.0 - 10.5 K/uL   RBC 4.99 3.87 - 5.11 MIL/uL   Hemoglobin 14.0 12.0 - 15.0 g/dL   HCT 24.442.5 01.036.0 - 27.246.0 %   MCV 85.2 78.0 - 100.0 fL   MCH 28.1 26.0 - 34.0 pg   MCHC 32.9 30.0 - 36.0 g/dL   RDW 53.612.9 64.411.5 - 03.415.5 %   Platelets 285 150 - 400 K/uL  I-stat troponin, ED  Result Value Ref Range   Troponin i, poc 0.00  0.00 - 0.08 ng/mL   Comment 3           Dg Chest 2 View  Result Date: 05/12/2017 CLINICAL DATA:  Ongoing headache for several days.  Left arm pain. EXAM: CHEST  2 VIEW COMPARISON:  12/10/2015. FINDINGS: Normal sized heart. Clear lungs. The lungs are mildly hyperexpanded. Unremarkable bones. IMPRESSION: No acute abnormality.  Stable mild changes of COPD. Electronically Signed   By: Beckie Salts M.D.   On: 05/12/2017 12:53   Initial Impression / Assessment and Plan / ED Course  I have reviewed the triage vital signs and the nursing notes.  Pertinent labs & imaging results that were available during my care of the patient were reviewed by me and considered in my medical decision making (see chart for details).     Strongly doubt cardiac etiology of symptoms. Symptoms are nonexertional arm pain is worse when she moves her arm. She does have mild neck pain there is possibility of cervical radiculopathy however all symptoms are chronic and mild neck pain does not explain headaches. Headaches do not occur at same time his arm pain. Her exam is normal, and symptoms are chronic A shows sinus to Dr. Rosalia Hammers 5:30 PM. CT scan of brain pending Final Clinical Impressions(s) / ED Diagnoses  Diagnosis #1 chronic headaches #2 chronic left arm pain Final diagnoses:  None    New Prescriptions New Prescriptions   No medications on file     Doug Sou, MD 05/12/17 1737

## 2017-05-12 NOTE — ED Notes (Signed)
ED Provider at bedside. 

## 2017-05-13 ENCOUNTER — Encounter: Payer: Self-pay | Admitting: Neurology

## 2017-08-22 ENCOUNTER — Ambulatory Visit: Payer: BC Managed Care – PPO | Admitting: Neurology

## 2017-10-31 ENCOUNTER — Emergency Department (HOSPITAL_COMMUNITY): Payer: BC Managed Care – PPO

## 2017-10-31 ENCOUNTER — Ambulatory Visit (HOSPITAL_COMMUNITY)
Admission: EM | Admit: 2017-10-31 | Discharge: 2017-10-31 | Disposition: A | Discharge status: Nursing Home (Includes ICF) | Payer: BC Managed Care – PPO | Source: Home / Self Care

## 2017-10-31 ENCOUNTER — Inpatient Hospital Stay (HOSPITAL_COMMUNITY)
Admission: EM | Admit: 2017-10-31 | Discharge: 2017-11-03 | DRG: 871 | Disposition: A | Discharge status: Home / Self Care | Payer: BC Managed Care – PPO | Attending: Family Medicine | Admitting: Family Medicine

## 2017-10-31 ENCOUNTER — Other Ambulatory Visit: Payer: Self-pay

## 2017-10-31 ENCOUNTER — Encounter (HOSPITAL_COMMUNITY): Payer: Self-pay

## 2017-10-31 DIAGNOSIS — K219 Gastro-esophageal reflux disease without esophagitis: Secondary | ICD-10-CM | POA: Diagnosis present

## 2017-10-31 DIAGNOSIS — A419 Sepsis, unspecified organism: Principal | ICD-10-CM

## 2017-10-31 DIAGNOSIS — Z87442 Personal history of urinary calculi: Secondary | ICD-10-CM | POA: Diagnosis not present

## 2017-10-31 DIAGNOSIS — J189 Pneumonia, unspecified organism: Secondary | ICD-10-CM

## 2017-10-31 DIAGNOSIS — J90 Pleural effusion, not elsewhere classified: Secondary | ICD-10-CM

## 2017-10-31 DIAGNOSIS — J181 Lobar pneumonia, unspecified organism: Secondary | ICD-10-CM | POA: Diagnosis present

## 2017-10-31 DIAGNOSIS — Z79899 Other long term (current) drug therapy: Secondary | ICD-10-CM | POA: Diagnosis not present

## 2017-10-31 DIAGNOSIS — E039 Hypothyroidism, unspecified: Secondary | ICD-10-CM | POA: Diagnosis present

## 2017-10-31 LAB — COMPREHENSIVE METABOLIC PANEL
ALT: 98 U/L — AB (ref 14–54)
AST: 63 U/L — AB (ref 15–41)
Albumin: 3 g/dL — ABNORMAL LOW (ref 3.5–5.0)
Alkaline Phosphatase: 140 U/L — ABNORMAL HIGH (ref 38–126)
Anion gap: 13 (ref 5–15)
BILIRUBIN TOTAL: 1 mg/dL (ref 0.3–1.2)
BUN: 8 mg/dL (ref 6–20)
CO2: 20 mmol/L — ABNORMAL LOW (ref 22–32)
CREATININE: 0.96 mg/dL (ref 0.44–1.00)
Calcium: 8.6 mg/dL — ABNORMAL LOW (ref 8.9–10.3)
Chloride: 99 mmol/L — ABNORMAL LOW (ref 101–111)
GFR, EST NON AFRICAN AMERICAN: 59 mL/min — AB (ref >60)
Glucose, Bld: 156 mg/dL — ABNORMAL HIGH (ref 65–99)
Potassium: 4.1 mmol/L (ref 3.5–5.1)
Sodium: 132 mmol/L — ABNORMAL LOW (ref 135–145)
TOTAL PROTEIN: 7.9 g/dL (ref 6.5–8.1)

## 2017-10-31 LAB — CBC WITH DIFFERENTIAL/PLATELET
Basophils Absolute: 0 10*3/uL (ref 0.0–0.1)
Basophils Relative: 0 %
Eosinophils Absolute: 0 10*3/uL (ref 0.0–0.7)
Eosinophils Relative: 0 %
HCT: 37.3 % (ref 36.0–46.0)
Hemoglobin: 12.5 g/dL (ref 12.0–15.0)
LYMPHS ABS: 2.1 10*3/uL (ref 0.7–4.0)
Lymphocytes Relative: 9 %
MCH: 28.1 pg (ref 26.0–34.0)
MCHC: 33.5 g/dL (ref 30.0–36.0)
MCV: 83.8 fL (ref 78.0–100.0)
MONO ABS: 1.7 10*3/uL — AB (ref 0.1–1.0)
Monocytes Relative: 7 %
NEUTROS PCT: 84 %
Neutro Abs: 20 10*3/uL — ABNORMAL HIGH (ref 1.7–7.7)
PLATELETS: 514 10*3/uL — AB (ref 150–400)
RBC: 4.45 MIL/uL (ref 3.87–5.11)
RDW: 12.9 % (ref 11.5–15.5)
WBC: 23.8 10*3/uL — AB (ref 4.0–10.5)

## 2017-10-31 LAB — URINALYSIS, ROUTINE W REFLEX MICROSCOPIC
Bilirubin Urine: NEGATIVE
GLUCOSE, UA: NEGATIVE mg/dL
HGB URINE DIPSTICK: NEGATIVE
Ketones, ur: 5 mg/dL — AB
LEUKOCYTES UA: NEGATIVE
NITRITE: NEGATIVE
PH: 5 (ref 5.0–8.0)
Protein, ur: 100 mg/dL — AB
SPECIFIC GRAVITY, URINE: 1.028 (ref 1.005–1.030)

## 2017-10-31 LAB — LACTIC ACID, PLASMA: Lactic Acid, Venous: 1.3 mmol/L (ref 0.5–1.9)

## 2017-10-31 LAB — I-STAT TROPONIN, ED
TROPONIN I, POC: 0 ng/mL (ref 0.00–0.08)
Troponin i, poc: 0 ng/mL (ref 0.00–0.08)

## 2017-10-31 LAB — I-STAT CG4 LACTIC ACID, ED: LACTIC ACID, VENOUS: 1.31 mmol/L (ref 0.5–1.9)

## 2017-10-31 LAB — PROCALCITONIN: PROCALCITONIN: 0.36 ng/mL

## 2017-10-31 MED ORDER — SODIUM CHLORIDE 0.9 % IV BOLUS (SEPSIS)
1000.0000 mL | Freq: Once | INTRAVENOUS | Status: AC
  Administered 2017-10-31: 1000 mL via INTRAVENOUS

## 2017-10-31 MED ORDER — SODIUM CHLORIDE 0.9 % IV SOLN
1.0000 g | Freq: Once | INTRAVENOUS | Status: AC
  Administered 2017-10-31: 1 g via INTRAVENOUS
  Filled 2017-10-31: qty 10

## 2017-10-31 MED ORDER — ENOXAPARIN SODIUM 40 MG/0.4ML ~~LOC~~ SOLN
40.0000 mg | SUBCUTANEOUS | Status: DC
  Administered 2017-11-01: 40 mg via SUBCUTANEOUS
  Filled 2017-10-31 (×3): qty 0.4

## 2017-10-31 MED ORDER — ACETAMINOPHEN 325 MG PO TABS
650.0000 mg | ORAL_TABLET | Freq: Four times a day (QID) | ORAL | Status: DC | PRN
  Administered 2017-11-01 – 2017-11-02 (×5): 650 mg via ORAL
  Filled 2017-10-31 (×5): qty 2

## 2017-10-31 MED ORDER — OXYCODONE-ACETAMINOPHEN 5-325 MG PO TABS
1.0000 | ORAL_TABLET | ORAL | Status: DC | PRN
  Administered 2017-10-31: 1 via ORAL
  Filled 2017-10-31 (×2): qty 1

## 2017-10-31 MED ORDER — SODIUM CHLORIDE 0.9 % IV BOLUS (SEPSIS)
500.0000 mL | Freq: Once | INTRAVENOUS | Status: AC
  Administered 2017-10-31: 500 mL via INTRAVENOUS

## 2017-10-31 MED ORDER — SODIUM CHLORIDE 0.9 % IV SOLN
500.0000 mg | INTRAVENOUS | Status: DC
  Administered 2017-10-31: 500 mg via INTRAVENOUS
  Filled 2017-10-31 (×2): qty 500

## 2017-10-31 MED ORDER — SODIUM CHLORIDE 0.9 % IV SOLN: 500.0000 mg | Freq: Once | INTRAVENOUS | Status: DC

## 2017-10-31 MED ORDER — DM-GUAIFENESIN ER 30-600 MG PO TB12: 1.0000 | ORAL_TABLET | Freq: Two times a day (BID) | ORAL | Status: DC | PRN

## 2017-10-31 MED ORDER — ACETAMINOPHEN 325 MG PO TABS
650.0000 mg | ORAL_TABLET | Freq: Once | ORAL | Status: AC
  Administered 2017-10-31: 650 mg via ORAL
  Filled 2017-10-31: qty 2

## 2017-10-31 MED ORDER — SODIUM CHLORIDE 0.9 % IV SOLN
1.0000 g | INTRAVENOUS | Status: DC
  Administered 2017-11-01: 1 g via INTRAVENOUS
  Filled 2017-10-31: qty 10

## 2017-10-31 MED ORDER — ZOLPIDEM TARTRATE 5 MG PO TABS: 5.0000 mg | ORAL_TABLET | Freq: Every evening | ORAL | Status: DC | PRN

## 2017-10-31 MED ORDER — PANTOPRAZOLE SODIUM 40 MG PO TBEC: 40.0000 mg | DELAYED_RELEASE_TABLET | Freq: Every day | ORAL | Status: DC | PRN

## 2017-10-31 MED ORDER — LEVALBUTEROL HCL 1.25 MG/0.5ML IN NEBU
1.2500 mg | INHALATION_SOLUTION | Freq: Four times a day (QID) | RESPIRATORY_TRACT | Status: DC
  Administered 2017-11-01 (×2): 1.25 mg via RESPIRATORY_TRACT
  Filled 2017-10-31 (×3): qty 0.5

## 2017-10-31 MED ORDER — SODIUM CHLORIDE 0.9 % IV SOLN
INTRAVENOUS | Status: DC
  Administered 2017-11-01 – 2017-11-02 (×3): via INTRAVENOUS

## 2017-10-31 MED ORDER — ONDANSETRON HCL 4 MG/2ML IJ SOLN: 4.0000 mg | Freq: Three times a day (TID) | INTRAMUSCULAR | Status: DC | PRN

## 2017-10-31 NOTE — ED Triage Notes (Signed)
Pt states she was dx with the flu last week, given tamiflu. Pt febrile in triage. Pt has continued to have cough, fevers, shortness of breath. No distress noted.

## 2017-10-31 NOTE — ED Notes (Signed)
Admitting Provider at bedside. 

## 2017-10-31 NOTE — H&P (Signed)
History and Physical    Rachael ErikssonRochelle Diane Masin ZOX:[id number] DOB: 01/05/1951 DOA: 10/31/2017  Referring MD/NP/PA:   PCP: Lorre MunroeBaity, Regina W, NP   Patient coming from:  The patient is coming from home.  At baseline, pt is independent for most of ADL.  Chief Complaint: Cough, fever, chills, shortness of breath, chest pain  HPI: Rachael Powell is a 67 y.o. female with medical history significant of hypothyroidism, GERD, depression, who presents with cough, fever, chills, shortness breath and chest pain.  Patient states that she was diagnosed with flu last week. She took Tamiflu for 4 days (until Sunday). She states that she continues to have fever, chills, cough. She coughs up greenish colored sputum. She has some mild SOB. Patient speaks in full sentence. She reports chest pain, which is located in the right lower chest, below the rib cage. The chest pain is constant, 10 out of 10 in severity, nonradiating, pleuritic, aggravated by coughing and deep breath. No tenderness in the calf areas. Patient states that she has nausea and vomited several times. She also had diarrhea in the past 2 or 3 days, which has resolved currently. Currently patient has nausea, no vomiting, diarrhea or abdominal pain. Denies symptoms of UTI or unilateral weakness.  ED Course: pt was found to have  WBC 23.8, lactic acid 1.31, negative troponin, electrolytes renal function okay, temperature 103, tachycardia, tachypnea, oxygen saturation 94% on room air. Chest x-ray showed infiltration in the right lower lobe. Patient is admitted to telemetry bed as inpatient.  Review of Systems:   General: has fevers, chills, no body weight gain, has poor appetite, has fatigue HEENT: no blurry vision, hearing changes or sore throat Respiratory: has dyspnea, coughing, no wheezing CV: no chest pain, no palpitations GI: had nausea, vomiting,  diarrhea, no constipation,  No abdominal pain, GU: no dysuria, burning on urination, increased  urinary frequency, hematuria  Ext: no leg edema Neuro: no unilateral weakness, numbness, or tingling, no vision change or hearing loss Skin: no rash, no skin tear. MSK: No muscle spasm, no deformity, no limitation of range of movement in spin Heme: No easy bruising.  Travel history: No recent long distant travel.  Allergy: No Known Allergies  Past Medical History:  Diagnosis Date  . Chicken pox   . Chronic kidney disease    right renal stone  . Colon polyps   . Depression    no medication  . Frequent headaches   . GERD (gastroesophageal reflux disease)   . Hypothyroidism    possible low thyroid. No meds  . Thyroid disease     Past Surgical History:  Procedure Laterality Date  . DILATION AND CURETTAGE OF UTERUS    . HAMMER TOE SURGERY    . TUBAL LIGATION      Social History:  reports that  has never smoked. she has never used smokeless tobacco. She reports that she does not drink alcohol or use drugs.  Family History:  Family History  Problem Relation Age of Onset  . Diabetes Mother   . Diabetes Father      Prior to Admission medications   Medication Sig Start Date End Date Taking? Authorizing Provider  cyclobenzaprine (AMRIX) 15 MG 24 hr capsule Take 1 capsule (15 mg total) by mouth daily as needed for muscle spasms. Patient not taking: Reported on 05/12/2017 02/01/16   Wallis BambergMani, Mario, PA-C  esomeprazole (NEXIUM) 40 MG capsule Take 1 capsule (40 mg total) by mouth daily. Patient not taking: Reported on 05/12/2017  12/10/15   Charlynne Pander, MD  naproxen sodium (ANAPROX DS) 550 MG tablet Take 1 tablet (550 mg total) by mouth 2 (two) times daily with a meal. Patient not taking: Reported on 05/12/2017 02/01/16   Wallis Bamberg, PA-C    Physical Exam: Vitals:   10/31/17 1900 10/31/17 1905 10/31/17 1915 10/31/17 2000  BP: 120/77  106/64 123/73  Pulse: 93  91 96  Resp: (!) 25  (!) 33 (!) 36  Temp:  100.2 F (37.9 C)    TempSrc:  Oral    SpO2: 95%  96% 96%   General: Not in  acute distress HEENT:       Eyes: PERRL, EOMI, no scleral icterus.       ENT: No discharge from the ears and nose, no pharynx injection, no tonsillar enlargement.        Neck: No JVD, no bruit, no mass felt. Heme: No neck lymph node enlargement. Cardiac: S1/S2, RRR, No murmurs, No gallops or rubs. Respiratory: No rales, wheezing, rhonchi or rubs. GI: Soft, nondistended, nontender, no rebound pain, no organomegaly, BS present. GU: No hematuria Ext: No pitting leg edema bilaterally. 2+DP/PT pulse bilaterally. Musculoskeletal: No joint deformities, No joint redness or warmth, no limitation of ROM in spin. Skin: No rashes.  Neuro: Alert, oriented X3, cranial nerves II-XII grossly intact, moves all extremities normally. Psych: Patient is not psychotic, no suicidal or hemocidal ideation.  Labs on Admission: I have personally reviewed following labs and imaging studies  CBC: Recent Labs  Lab 10/31/17 1645  WBC 23.8*  NEUTROABS 20.0*  HGB 12.5  HCT 37.3  MCV 83.8  PLT 514*   Basic Metabolic Panel: Recent Labs  Lab 10/31/17 1645  NA 132*  K 4.1  CL 99*  CO2 20*  GLUCOSE 156*  BUN 8  CREATININE 0.96  CALCIUM 8.6*   GFR: CrCl cannot be calculated (Unknown ideal weight.). Liver Function Tests: Recent Labs  Lab 10/31/17 1645  AST 63*  ALT 98*  ALKPHOS 140*  BILITOT 1.0  PROT 7.9  ALBUMIN 3.0*   No results for input(s): LIPASE, AMYLASE in the last 168 hours. No results for input(s): AMMONIA in the last 168 hours. Coagulation Profile: No results for input(s): INR, PROTIME in the last 168 hours. Cardiac Enzymes: No results for input(s): CKTOTAL, CKMB, CKMBINDEX, TROPONINI in the last 168 hours. BNP (last 3 results) No results for input(s): PROBNP in the last 8760 hours. HbA1C: No results for input(s): HGBA1C in the last 72 hours. CBG: No results for input(s): GLUCAP in the last 168 hours. Lipid Profile: No results for input(s): CHOL, HDL, LDLCALC, TRIG, CHOLHDL,  LDLDIRECT in the last 72 hours. Thyroid Function Tests: No results for input(s): TSH, T4TOTAL, FREET4, T3FREE, THYROIDAB in the last 72 hours. Anemia Panel: No results for input(s): VITAMINB12, FOLATE, FERRITIN, TIBC, IRON, RETICCTPCT in the last 72 hours. Urine analysis:    Component Value Date/Time   BILIRUBINUR neg 09/06/2013 1018   PROTEINUR 100 09/06/2013 1018   UROBILINOGEN 1.0 09/06/2013 1018   NITRITE nege 09/06/2013 1018   LEUKOCYTESUR Trace 09/06/2013 1018   Sepsis Labs: @LABRCNTIP (procalcitonin:4,lacticidven:4) )No results found for this or any previous visit (from the past 240 hour(s)).   Radiological Exams on Admission: Dg Chest 2 View  Result Date: 10/31/2017 CLINICAL DATA:  Pt was diagnosed with the flu on Friday 2/15. Since then she has developed a sever cough and SOB. Pt states that her right side hurts when she breaths in. EXAM: CHEST -  2 VIEW COMPARISON:  05/12/2017 FINDINGS: New airspace consolidation in basilar segments of the right lower lobe. Adjacent small pleural effusion is suspected. Left lung clear. Heart size and mediastinal contours are within normal limits. No pneumothorax. Visualized bones unremarkable. IMPRESSION: 1. Right lower lobe airspace disease suggesting pneumonia, with probable adjacent small effusion. Electronically Signed   By: Corlis Leak M.D.   On: 10/31/2017 17:28     EKG: Independently reviewed. Sinus rhythm, tachycardia, QTC 409, LAD, nonspecific T-wave change.   Assessment/Plan Principal Problem:   Lobar pneumonia (HCC) Active Problems:   Hypothyroidism   GERD (gastroesophageal reflux disease)   Sepsis (HCC)   Sepsis due to Lobar pneumonia: Patient meets criteria for sepsis with leukocytosis, fever, tachycardia and tachypnea. Lactic acid is normal. Currently hemodynamically stable.  - will admit to tele bed as inpt - IV Rocephin and azithromycin, doxycycline - Mucinex for cough  - Xopenex Neb prn for SOB - Urine legionella and  S. pneumococcal antigen - Follow up blood culture x2, sputum culture  - will get Procalcitonin and trend lactic acid level per sepsis protocol - IVF: 2.5L of NS bolus in ED, followed by 125 mL per hour of NS  Hypothyroidism: Last TSH was 3.438 on 01/04/15. Pt is not taking medications currently. -f/u with PCP  GERD: -Protonix prn  DVT ppx: SQ Lovenox Code Status: Full code Family Communication:  Yes, patient's daughter at bed side Disposition Plan:  Anticipate discharge back to previous home environment Consults called:  none Admission status:  Inpatient/tele     Date of Service 10/31/2017    Lorretta Harp [facility] Pager [phone]  If 7PM-7AM, please contact night-coverage [email or web address] Password [facility] 10/31/2017, 8:20 PM

## 2017-10-31 NOTE — ED Provider Notes (Addendum)
[facility] EMERGENCY DEPARTMENT Provider Note   CSN: [id number] Arrival date & time: 10/31/17  1543     History   Chief Complaint Chief Complaint  Patient presents with  . Cough    HPI Rachael Powell is a 67 y.o. female.  The history is provided by the patient, a relative and medical records.  Cough  This is a new problem. The current episode started more than 1 week ago. The problem occurs constantly. The problem has been rapidly worsening. The cough is productive of sputum. The maximum temperature recorded prior to her arrival was 103 to 104 F. The fever has been present for 1 to 2 days. Associated symptoms include chest pain, chills, sweats, rhinorrhea and shortness of breath. Pertinent negatives include no headaches and no wheezing. She has tried nothing for the symptoms. Her past medical history does not include asthma. Past medical history comments: influenza.    Past Medical History:  Diagnosis Date  . Chicken pox   . Chronic kidney disease    right renal stone  . Colon polyps   . Depression    no medication  . Frequent headaches   . GERD (gastroesophageal reflux disease)   . Hypothyroidism    possible low thyroid. No meds  . Thyroid disease     There are no active problems to display for this patient.   Past Surgical History:  Procedure Laterality Date  . DILATION AND CURETTAGE OF UTERUS    . HAMMER TOE SURGERY    . TUBAL LIGATION      OB History    No data available       Home Medications    Prior to Admission medications   Medication Sig Start Date End Date Taking? Authorizing Provider  cyclobenzaprine (AMRIX) 15 MG 24 hr capsule Take 1 capsule (15 mg total) by mouth daily as needed for muscle spasms. Patient not taking: Reported on 05/12/2017 02/01/16   Wallis Bamberg, PA-C  esomeprazole (NEXIUM) 40 MG capsule Take 1 capsule (40 mg total) by mouth daily. Patient not taking: Reported on 05/12/2017 12/10/15   Charlynne Pander, MD    naproxen sodium (ANAPROX DS) 550 MG tablet Take 1 tablet (550 mg total) by mouth 2 (two) times daily with a meal. Patient not taking: Reported on 05/12/2017 02/01/16   Wallis Bamberg, PA-C    Family History Family History  Problem Relation Age of Onset  . Diabetes Mother   . Diabetes Father     Social History Social History   Tobacco Use  . Smoking status: Never Smoker  . Smokeless tobacco: Never Used  Substance Use Topics  . Alcohol use: No  . Drug use: No     Allergies   Patient has no known allergies.   Review of Systems Review of Systems  Constitutional: Positive for chills, diaphoresis, fatigue and fever.  HENT: Positive for rhinorrhea.   Eyes: Negative for visual disturbance.  Respiratory: Positive for cough, chest tightness and shortness of breath. Negative for wheezing.   Cardiovascular: Positive for chest pain. Negative for palpitations and leg swelling.  Gastrointestinal: Negative for constipation, diarrhea, nausea and vomiting.  Genitourinary: Negative for dysuria.  Musculoskeletal: Negative for neck pain.  Skin: Negative for rash and wound.  Neurological: Negative for light-headedness and headaches.  All other systems reviewed and are negative.    Physical Exam Updated Vital Signs BP 111/63   Pulse (!) 101   Temp 100.2 F (37.9 C) (Oral)   Resp  18   SpO2 94%   Physical Exam  Constitutional: She is oriented to person, place, and time. She appears well-developed and well-nourished. No distress.  HENT:  Head: Normocephalic and atraumatic.  Mouth/Throat: Oropharynx is clear and moist. No oropharyngeal exudate.  Eyes: Conjunctivae and EOM are normal. Pupils are equal, round, and reactive to light.  Neck: Normal range of motion.  Cardiovascular: Intact distal pulses.  No murmur heard. Pulmonary/Chest: No respiratory distress. She has no wheezes. She has rhonchi in the right upper field, the right middle field and the right lower field. She exhibits  tenderness.    Abdominal: Soft. She exhibits no distension. There is no tenderness.  Musculoskeletal: She exhibits no edema or tenderness.  Neurological: She is alert and oriented to person, place, and time. No sensory deficit. She exhibits normal muscle tone.  Skin: Capillary refill takes less than 2 seconds. No rash noted. She is not diaphoretic. No erythema.  Psychiatric: She has a normal mood and affect.  Nursing note and vitals reviewed.    ED Treatments / Results  Labs (all labs ordered are listed, but only abnormal results are displayed) Labs Reviewed  COMPREHENSIVE METABOLIC PANEL - Abnormal; Notable for the following components:      Result Value   Sodium 132 (*)    Chloride 99 (*)    CO2 20 (*)    Glucose, Bld 156 (*)    Calcium 8.6 (*)    Albumin 3.0 (*)    AST 63 (*)    ALT 98 (*)    Alkaline Phosphatase 140 (*)    GFR calc non Af Amer 59 (*)    All other components within normal limits  CBC WITH DIFFERENTIAL/PLATELET - Abnormal; Notable for the following components:   WBC 23.8 (*)    Platelets 514 (*)    Neutro Abs 20.0 (*)    Monocytes Absolute 1.7 (*)    All other components within normal limits  URINALYSIS, ROUTINE W REFLEX MICROSCOPIC - Abnormal; Notable for the following components:   Color, Urine AMBER (*)    APPearance HAZY (*)    Ketones, ur 5 (*)    Protein, ur 100 (*)    Bacteria, UA RARE (*)    Squamous Epithelial / LPF 6-30 (*)    All other components within normal limits  CULTURE, BLOOD (ROUTINE X 2)  CULTURE, BLOOD (ROUTINE X 2)  CULTURE, EXPECTORATED SPUTUM-ASSESSMENT  GRAM STAIN  LACTIC ACID, PLASMA  PROCALCITONIN  HIV ANTIBODY (ROUTINE TESTING)  STREP PNEUMONIAE URINARY ANTIGEN  LEGIONELLA PNEUMOPHILA SEROGP 1 UR AG  I-STAT CG4 LACTIC ACID, ED  I-STAT TROPONIN, ED  I-STAT TROPONIN, ED  I-STAT CG4 LACTIC ACID, ED    EKG  EKG Interpretation  Date/Time:  Friday October 31 2017 16:58:54 EST Ventricular Rate:  106 PR  Interval:  164 QRS Duration: 82 QT Interval:  308 QTC Calculation: 409 R Axis:   11 Text Interpretation:  Sinus tachycardia Septal infarct , age undetermined Abnormal ECG when compared to prior, no significant changes seen.  No STEMI Confirmed by Theda Belfast ([id number]) on 10/31/2017 6:50:54 PM       Radiology Dg Chest 2 View  Result Date: 10/31/2017 CLINICAL DATA:  Pt was diagnosed with the flu on Friday 2/15. Since then she has developed a sever cough and SOB. Pt states that her right side hurts when she breaths in. EXAM: CHEST - 2 VIEW COMPARISON:  05/12/2017 FINDINGS: New airspace consolidation in basilar segments of the right lower  lobe. Adjacent small pleural effusion is suspected. Left lung clear. Heart size and mediastinal contours are within normal limits. No pneumothorax. Visualized bones unremarkable. IMPRESSION: 1. Right lower lobe airspace disease suggesting pneumonia, with probable adjacent small effusion. Electronically Signed   By: Corlis Leak  Hassell M.D.   On: 10/31/2017 17:28    Procedures Procedures (including critical care time)  CRITICAL CARE Performed by: Canary Brimhristopher J Tegeler Total critical care time: 35 minutes Critical care time was exclusive of separately billable procedures and treating other patients. Critical care was necessary to treat or prevent imminent or life-threatening deterioration. Critical care was time spent personally by me on the following activities: development of treatment plan with patient and/or surrogate as well as nursing, discussions with consultants, evaluation of patient's response to treatment, examination of patient, obtaining history from patient or surrogate, ordering and performing treatments and interventions, ordering and review of laboratory studies, ordering and review of radiographic studies, pulse oximetry and re-evaluation of patient's condition.   Medications Ordered in ED Medications  acetaminophen (TYLENOL) tablet 650 mg (not  administered)  pantoprazole (PROTONIX) EC tablet 40 mg (not administered)  levalbuterol (XOPENEX) nebulizer solution 1.25 mg (1.25 mg Nebulization Given 11/01/17 0110)  dextromethorphan-guaiFENesin (MUCINEX DM) 30-600 MG per 12 hr tablet 1 tablet (not administered)  oxyCODONE-acetaminophen (PERCOCET/ROXICET) 5-325 MG per tablet 1 tablet (1 tablet Oral Given 10/31/17 2052)  0.9 %  sodium chloride infusion ( Intravenous New Bag/Given 11/01/17 0029)  enoxaparin (LOVENOX) injection 40 mg (not administered)  cefTRIAXone (ROCEPHIN) 1 g in sodium chloride 0.9 % 100 mL IVPB (not administered)  azithromycin (ZITHROMAX) 500 mg in sodium chloride 0.9 % 250 mL IVPB (0 mg Intravenous Stopped 10/31/17 2149)  ondansetron (ZOFRAN) injection 4 mg (not administered)  zolpidem (AMBIEN) tablet 5 mg (not administered)  morphine 4 MG/ML injection 2 mg (2 mg Intravenous Given 11/01/17 0031)  acetaminophen (TYLENOL) tablet 650 mg (650 mg Oral Given 10/31/17 1648)  cefTRIAXone (ROCEPHIN) 1 g in sodium chloride 0.9 % 100 mL IVPB (0 g Intravenous Stopped 10/31/17 2031)  sodium chloride 0.9 % bolus 1,000 mL (0 mLs Intravenous Stopped 10/31/17 2057)  sodium chloride 0.9 % bolus 1,000 mL (0 mLs Intravenous Stopped 10/31/17 2141)  sodium chloride 0.9 % bolus 500 mL (0 mLs Intravenous Stopped 10/31/17 2227)  HYDROmorphone (DILAUDID) injection 1 mg (1 mg Intravenous Given 11/01/17 0202)     Initial Impression / Assessment and Plan / ED Course  I have reviewed the triage vital signs and the nursing notes.  Pertinent labs & imaging results that were available during my care of the patient were reviewed by me and considered in my medical decision making (see chart for details).     Kathryne ErikssonRochelle Diane Powell is a 67 y.o. female with a past medical history significant for thyroid disease and CKD with recent diagnosis of influenza who presents with worsening fevers, chills, congestion, cough, malaise, intermittent diarrhea and right-sided  chest pain.  Patient reports that for the last 2 weeks he has been feeling sick.  She reports that after 4 days of symptoms she went to see her primary doctor and was diagnosed with influenza.  Patient was started on Tamiflu.  She reports she took his medications but did not have significant improvement.  She reports that she is continued to worsen and has now had productive cough.  She reports that today she started feeling more lightheaded and fatigued.  She also felt febrile.  At arrival, temperature was 103.  Patient says that she is  been having right-sided chest pain that is sharp and pleuritic.  She also has tenderness in the right chest and right upper abdomen.  Patient denies any history of this problem.  She reports he has had less oral intake while she is been feeling bad and is also had some diarrhea.  She denies any urinary symptoms or constipation.  She denies any chest trauma.  She was found to have oxygen saturations in the 90% range when 911 was initially called.  After oxygen being provided by nasal cannula, her sats improved and then she was weaned off of it.  Patient then went to her urgent care where she was instructed to come to the ED.  On arrival, oxygen saturations are in the very low 90s while at rest.  Patient was found to be febrile and tachycardic.  On exam, patient had asymmetry in her breath sounds with very coarse breath sounds on the right side.  Patient's right lateral chest and abdomen were tender to palpation.  Patient had no murmur and otherwise had no abdominal tenderness.  Patient had no significant edema.  Patient was diaphoretic and febrile and appeared uncomfortable.  Patient had screening workup including chest x-ray that showed evidence of pneumonia.  White count was elevated at 23.  Given patient's clinical context I am concerned patient had influenza that has now transitioned into a acute bacterial pneumonia.  Given patient's intermittent hypoxia, poor appearance,  and sepsis, patient will be treated for pneumonia.  Patient will be admitted for further management of pneumonia.    Final Clinical Impressions(s) / ED Diagnoses   Final diagnoses:  Community acquired pneumonia of right lower lobe of lung (HCC)  Sepsis, due to unspecified organism [facility])    ED Discharge Orders    None      Clinical Impression: 1. Community acquired pneumonia of right lower lobe of lung (HCC)   2. Sepsis, due to unspecified organism (HCC)   3. Hypothyroidism, unspecified type   4. Gastroesophageal reflux disease without esophagitis     Disposition: Admit  This note was prepared with assistance of Dragon voice recognition software. Occasional wrong-word or sound-a-like substitutions may have occurred due to the inherent limitations of voice recognition software.      Tegeler, Canary Brim, MD 11/01/17 0216    Tegeler, Canary Brim, MD 11/10/17 1000

## 2017-11-01 ENCOUNTER — Encounter (HOSPITAL_COMMUNITY): Payer: Self-pay | Admitting: *Deleted

## 2017-11-01 ENCOUNTER — Other Ambulatory Visit: Payer: Self-pay

## 2017-11-01 LAB — HIV ANTIBODY (ROUTINE TESTING W REFLEX): HIV SCREEN 4TH GENERATION: NONREACTIVE

## 2017-11-01 LAB — STREP PNEUMONIAE URINARY ANTIGEN: Strep Pneumo Urinary Antigen: NEGATIVE

## 2017-11-01 MED ORDER — HYDROMORPHONE HCL 1 MG/ML IJ SOLN
1.0000 mg | Freq: Once | INTRAMUSCULAR | Status: AC
  Administered 2017-11-01: 1 mg via INTRAVENOUS
  Filled 2017-11-01: qty 1

## 2017-11-01 MED ORDER — MORPHINE SULFATE (PF) 4 MG/ML IV SOLN
2.0000 mg | INTRAVENOUS | Status: DC | PRN
  Administered 2017-11-01: 2 mg via INTRAVENOUS
  Filled 2017-11-01: qty 1

## 2017-11-01 MED ORDER — CEFPODOXIME PROXETIL 200 MG PO TABS
200.0000 mg | ORAL_TABLET | Freq: Two times a day (BID) | ORAL | Status: DC
  Administered 2017-11-02 – 2017-11-03 (×3): 200 mg via ORAL
  Filled 2017-11-01 (×4): qty 1

## 2017-11-01 MED ORDER — LEVALBUTEROL HCL 1.25 MG/0.5ML IN NEBU: 1.2500 mg | INHALATION_SOLUTION | Freq: Four times a day (QID) | RESPIRATORY_TRACT | Status: DC | PRN

## 2017-11-01 MED ORDER — AZITHROMYCIN 500 MG PO TABS
250.0000 mg | ORAL_TABLET | Freq: Every day | ORAL | Status: DC
  Administered 2017-11-02: 250 mg via ORAL
  Filled 2017-11-01: qty 1

## 2017-11-01 NOTE — ED Notes (Signed)
Pharmacy notified of missing medications  

## 2017-11-01 NOTE — Progress Notes (Signed)
PROGRESS NOTE    Rachael ErikssonRochelle Diane Powell  YNW:[id number]  DOB: 08/26/1951  DOA: 10/31/2017 PCP: Lorre MunroeBaity, Regina W, NP Outpatient Specialists:   Hospital course: Rachael ErikssonRochelle Diane Powell is a 67 y.o. female with medical history significant of hypothyroidism, GERD, depression, who presents with productive cough, fever, chills, shortness breath and chest pain after having the flu a week prior. Found to have RLL infiltrate on CXR, consistent with CAP  Assessment & Plan:   Principal Problem:   Lobar pneumonia (HCC) Active Problems:   Hypothyroidism   GERD (gastroesophageal reflux disease)   Sepsis (HCC)   # RLL PNA, post flu. Still borderline befrile - IV azithro and ceftriaxone (D1 10/31/17) - plan to switch to oral moxi or azithro/cephalosporin - prn nebs - cultures pending  # Hypothyroidism (chronic issue noted on 12/2014) - follow as outpatient   DVT prophylaxis: lovenox ppx dosing Code Status: full Family Communication: daughter Disposition Plan: inpatient until sx improve, likely 11/02/17 dc home   Consultants:  none  Procedures:  none  Antimicrobials:  azithro and ceftriaxone and above   Subjective: - still productive cough, pleuritic chest pain, mild DOE  Objective: Vitals:   11/01/17 1219 11/01/17 1315 11/01/17 1410 11/01/17 1513  BP:  121/82    Pulse: 91 92    Resp: (!) 28 (!) 35    Temp:      TempSrc:      SpO2: 95% 95%  93%  Weight:   78.9 kg (174 lb)   Height:   5\' 4"  (1.626 m)     Intake/Output Summary (Last 24 hours) at 11/01/2017 1701 Last data filed at 11/01/2017 1600 Gross per 24 hour  Intake 3950 ml  Output -  Net 3950 ml   Filed Weights   11/01/17 1410  Weight: 78.9 kg (174 lb)    Exam:  General exam: middle aged african american woman, resting in bed, appears uncomfortable, not in acute distress Respiratory system:  Diminished at R lung base. mildly increased work of breathing; symmetric air movement Cardiovascular system: S1 & S2  heard, RRR. No JVD, murmurs, gallops, clicks or pedal edema. Gastrointestinal system: Abdomen is nondistended, soft and nontender. Normal bowel sounds heard. Central nervous system: Alert and oriented. No focal neurological deficits. Extremities: Symmetric, no focal deficits   Data Reviewed: Basic Metabolic Panel: Recent Labs  Lab 10/31/17 1645  NA 132*  K 4.1  CL 99*  CO2 20*  GLUCOSE 156*  BUN 8  CREATININE 0.96  CALCIUM 8.6*   Liver Function Tests: Recent Labs  Lab 10/31/17 1645  AST 63*  ALT 98*  ALKPHOS 140*  BILITOT 1.0  PROT 7.9  ALBUMIN 3.0*   No results for input(s): LIPASE, AMYLASE in the last 168 hours. No results for input(s): AMMONIA in the last 168 hours. CBC: Recent Labs  Lab 10/31/17 1645  WBC 23.8*  NEUTROABS 20.0*  HGB 12.5  HCT 37.3  MCV 83.8  PLT 514*   Cardiac Enzymes: No results for input(s): CKTOTAL, CKMB, CKMBINDEX, TROPONINI in the last 168 hours. BNP (last 3 results) No results for input(s): PROBNP in the last 8760 hours. CBG: No results for input(s): GLUCAP in the last 168 hours.  Recent Results (from the past 240 hour(s))  Blood culture (routine x 2)     Status: None (Preliminary result)   Collection Time: 10/31/17  7:13 PM  Result Value Ref Range Status   Specimen Description BLOOD RIGHT ANTECUBITAL  Final   Special Requests   Final  IN BOTH AEROBIC AND ANAEROBIC BOTTLES Blood Culture adequate volume   Culture   Final    NO GROWTH < 24 HOURS Performed at [facility], [address], [place], [place] [address]    Report Status PENDING  Incomplete  Blood culture (routine x 2)     Status: None (Preliminary result)   Collection Time: 10/31/17  7:18 PM  Result Value Ref Range Status   Specimen Description BLOOD RIGHT HAND  Final   Special Requests   Final    IN BOTH AEROBIC AND ANAEROBIC BOTTLES Blood Culture adequate volume   Culture   Final    NO GROWTH < 24 HOURS Performed at [facility], [address], [place], [place] [address]    Report Status PENDING  Incomplete  Culture, respiratory (NON-Expectorated)     Status: None (Preliminary result)   Collection Time: 11/01/17 10:27 AM  Result Value Ref Range Status   Specimen Description SPUTUM  Final   Special Requests NONE  Final   Gram Stain   Final    ABUNDANT WBC PRESENT, PREDOMINANTLY PMN NO SQUAMOUS EPITHELIAL CELLS SEEN FEW GRAM NEGATIVE COCCI IN PAIRS RARE GRAM POSITIVE COCCI IN PAIRS RARE GRAM NEGATIVE RODS Performed at [facility], [address], [place], [place] [address]    Culture PENDING  Incomplete   Report Status PENDING  Incomplete         Studies: Dg Chest 2 View  Result Date: 10/31/2017 CLINICAL DATA:  Pt was diagnosed with the flu on Friday 2/15. Since then she has developed a sever cough and SOB. Pt states that her right side hurts when she breaths in. EXAM: CHEST - 2 VIEW COMPARISON:  05/12/2017 FINDINGS: New airspace consolidation in basilar segments of the right lower lobe. Adjacent small pleural effusion is suspected. Left lung clear. Heart size and mediastinal contours are within normal limits. No pneumothorax. Visualized bones unremarkable. IMPRESSION: 1. Right lower lobe airspace disease suggesting pneumonia, with probable adjacent small effusion. Electronically Signed   By: Corlis Leak M.D.   On: 10/31/2017 17:28        Scheduled Meds: . enoxaparin (LOVENOX) injection  40 mg Subcutaneous Q24H  . levalbuterol  1.25 mg Nebulization Q6H   Continuous Infusions: . sodium chloride Stopped (11/01/17 0931)  . azithromycin Stopped (10/31/17 2149)  . cefTRIAXone (ROCEPHIN)  IV Stopped (11/01/17 1634)    Principal Problem:   Lobar pneumonia (HCC) Active Problems:   Hypothyroidism   GERD (gastroesophageal reflux disease)   Sepsis (HCC)    Time spent: > 30 mins    Ike Bene, MD  [facility] Pager [phone]  If 7PM-7AM, please contact  night-coverage [email or web address] Password [email or web address] 11/01/2017, 5:01 PM    LOS: 1 day

## 2017-11-01 NOTE — Progress Notes (Signed)
RN paged Linton FlemingsX. Blount, NP to request K pad for patient to help with right lower rib pain, patient has taken Tylenol and states that is the only medication that helps, but voiced she is still having discomfort.  Awaiting response.  P.J. Henderson NewcomerSexton, RN

## 2017-11-01 NOTE — ED Notes (Signed)
Regular Diet ordered for Lunch. 

## 2017-11-02 ENCOUNTER — Inpatient Hospital Stay (HOSPITAL_COMMUNITY): Payer: BC Managed Care – PPO

## 2017-11-02 DIAGNOSIS — K219 Gastro-esophageal reflux disease without esophagitis: Secondary | ICD-10-CM

## 2017-11-02 DIAGNOSIS — J90 Pleural effusion, not elsewhere classified: Secondary | ICD-10-CM

## 2017-11-02 DIAGNOSIS — J181 Lobar pneumonia, unspecified organism: Secondary | ICD-10-CM

## 2017-11-02 LAB — BASIC METABOLIC PANEL
ANION GAP: 12 (ref 5–15)
BUN: 6 mg/dL (ref 6–20)
CHLORIDE: 106 mmol/L (ref 101–111)
CO2: 19 mmol/L — ABNORMAL LOW (ref 22–32)
Calcium: 7.6 mg/dL — ABNORMAL LOW (ref 8.9–10.3)
Creatinine, Ser: 0.71 mg/dL (ref 0.44–1.00)
GFR calc Af Amer: >60 mL/min (ref >60)
GLUCOSE: 122 mg/dL — AB (ref 65–99)
POTASSIUM: 3.8 mmol/L (ref 3.5–5.1)
SODIUM: 137 mmol/L (ref 135–145)

## 2017-11-02 LAB — MRSA PCR SCREENING: MRSA by PCR: NEGATIVE

## 2017-11-02 LAB — CBC WITH DIFFERENTIAL/PLATELET
BASOS ABS: 0 10*3/uL (ref 0.0–0.1)
BASOS PCT: 0 %
Eosinophils Absolute: 0.3 10*3/uL (ref 0.0–0.7)
Eosinophils Relative: 2 %
HEMATOCRIT: 34.4 % — AB (ref 36.0–46.0)
HEMOGLOBIN: 11.1 g/dL — AB (ref 12.0–15.0)
LYMPHS PCT: 14 %
Lymphs Abs: 1.9 10*3/uL (ref 0.7–4.0)
MCH: 27.5 pg (ref 26.0–34.0)
MCHC: 32.3 g/dL (ref 30.0–36.0)
MCV: 85.4 fL (ref 78.0–100.0)
MONO ABS: 1.3 10*3/uL — AB (ref 0.1–1.0)
Monocytes Relative: 9 %
NEUTROS ABS: 10.2 10*3/uL — AB (ref 1.7–7.7)
NEUTROS PCT: 75 %
Platelets: 468 10*3/uL — ABNORMAL HIGH (ref 150–400)
RBC: 4.03 MIL/uL (ref 3.87–5.11)
RDW: 13.2 % (ref 11.5–15.5)
WBC: 13.7 10*3/uL — ABNORMAL HIGH (ref 4.0–10.5)

## 2017-11-02 LAB — MAGNESIUM: MAGNESIUM: 2 mg/dL (ref 1.7–2.4)

## 2017-11-02 MED ORDER — IBUPROFEN 600 MG PO TABS: 600.0000 mg | ORAL_TABLET | Freq: Four times a day (QID) | ORAL | Status: DC | PRN

## 2017-11-02 MED ORDER — ACETAMINOPHEN 325 MG PO TABS
650.0000 mg | ORAL_TABLET | Freq: Four times a day (QID) | ORAL | Status: DC | PRN
  Administered 2017-11-02 – 2017-11-03 (×3): 650 mg via ORAL
  Filled 2017-11-02 (×3): qty 2

## 2017-11-02 NOTE — Progress Notes (Signed)
PROGRESS NOTE    Rachael Powell  ZOX:[id number] DOB: December 15, 1950 DOA: 10/31/2017 PCP: Lorre Munroe, NP   Brief Narrative: Rachael Powell is a 67 y.o. femalewith medical history significant ofhypothyroidism, GERD, depression, who presents with productive cough, fever, chills, shortness breath and chest pain after having the flu a week prior. Found to have RLL infiltrate on CXR, consistent with CAP. Also with worsening pleural effusion which is likely related to infection. Also, possible new developing LL infiltrate.   Assessment & Plan:   Principal Problem:   Lobar pneumonia (HCC) Active Problems:   Hypothyroidism   GERD (gastroesophageal reflux disease)   Sepsis (HCC)   RLL pneumonia Pleural effusion Patient on room air with tachypnea. Repeat chest x-ray ordered today and significant for worsening pleural effusion/infiltrate. Blood cultures negative to date -Continue Vantin/azithromycin -MRSA PCR -Sputum culture pending  Rib pain Secondary to pneumonia/coughing -Continue analgesics  GERD -Continue Protonix  ?Thyroid disease History of low TSH with normal repeat. Not on medications.   DVT prophylaxis: Lovneox Code Status:   Code Status: Full Code Family Communication: Daughter at bedside Disposition Plan: Discharge when medically stable   Consultants:   None  Procedures:   None  Antimicrobials:  Ceftriaxone (2/22>>2/23)  Azithromycin (2/22>>  Vantin (2/24>>   Subjective: Pleuritic chest pain. Cough.  Objective: Vitals:   11/01/17 1809 11/01/17 1811 11/01/17 2126 11/02/17 0531  BP:  109/67 135/75 (!) 148/79  Pulse:  94 89 91  Resp:  20 20 16   Temp:  98.3 F (36.8 C) 99 F (37.2 C) 98.1 F (36.7 C)  TempSrc:   Oral   SpO2:  95% 95% 94%  Weight:      Height: 5\' 4"  (1.626 m)       Intake/Output Summary (Last 24 hours) at 11/02/2017 1110 Last data filed at 11/02/2017 0535 Gross per 24 hour  Intake 1491.67 ml  Output -  Net  1491.67 ml   Filed Weights   11/01/17 1410  Weight: 78.9 kg (174 lb)    Examination:  General exam: Appears calm and comfortable  Respiratory system: Diminished at bilateral bases. Mild tachypnea. Able to speak in complete sentences Cardiovascular system: S1 & S2 heard, RRR. No murmurs. Gastrointestinal system: Abdomen is nondistended, soft and nontender. No organomegaly or masses felt. Normal bowel sounds heard. Central nervous system: Alert and oriented. No focal neurological deficits. Extremities: No edema. No calf tenderness Skin: No cyanosis. No rashes Psychiatry: Judgement and insight appear normal. Mood & affect appropriate.     Data Reviewed: I have personally reviewed following labs and imaging studies  CBC: Recent Labs  Lab 10/31/17 1645 11/02/17 0257  WBC 23.8* 13.7*  NEUTROABS 20.0* 10.2*  HGB 12.5 11.1*  HCT 37.3 34.4*  MCV 83.8 85.4  PLT 514* 468*   Basic Metabolic Panel: Recent Labs  Lab 10/31/17 1645 11/02/17 0257  NA 132* 137  K 4.1 3.8  CL 99* 106  CO2 20* 19*  GLUCOSE 156* 122*  BUN 8 6  CREATININE 0.96 0.71  CALCIUM 8.6* 7.6*  MG  --  2.0   GFR: Estimated Creatinine Clearance: 68.4 mL/min (by C-G formula based on SCr of 0.71 mg/dL). Liver Function Tests: Recent Labs  Lab 10/31/17 1645  AST 63*  ALT 98*  ALKPHOS 140*  BILITOT 1.0  PROT 7.9  ALBUMIN 3.0*   No results for input(s): LIPASE, AMYLASE in the last 168 hours. No results for input(s): AMMONIA in the last 168 hours. Coagulation Profile: No results  for input(s): INR, PROTIME in the last 168 hours. Cardiac Enzymes: No results for input(s): CKTOTAL, CKMB, CKMBINDEX, TROPONINI in the last 168 hours. BNP (last 3 results) No results for input(s): PROBNP in the last 8760 hours. HbA1C: No results for input(s): HGBA1C in the last 72 hours. CBG: No results for input(s): GLUCAP in the last 168 hours. Lipid Profile: No results for input(s): CHOL, HDL, LDLCALC, TRIG, CHOLHDL,  LDLDIRECT in the last 72 hours. Thyroid Function Tests: No results for input(s): TSH, T4TOTAL, FREET4, T3FREE, THYROIDAB in the last 72 hours. Anemia Panel: No results for input(s): VITAMINB12, FOLATE, FERRITIN, TIBC, IRON, RETICCTPCT in the last 72 hours. Sepsis Labs: Recent Labs  Lab 10/31/17 1858 10/31/17 2027  PROCALCITON  --  0.36  LATICACIDVEN 1.31 1.3    Recent Results (from the past 240 hour(s))  Blood culture (routine x 2)     Status: None (Preliminary result)   Collection Time: 10/31/17  7:13 PM  Result Value Ref Range Status   Specimen Description BLOOD RIGHT ANTECUBITAL  Final   Special Requests   Final    IN BOTH AEROBIC AND ANAEROBIC BOTTLES Blood Culture adequate volume   Culture   Final    NO GROWTH < 24 HOURS Performed at [facility], [address] [place], [place] [address]    Report Status PENDING  Incomplete  Blood culture (routine x 2)     Status: None (Preliminary result)   Collection Time: 10/31/17  7:18 PM  Result Value Ref Range Status   Specimen Description BLOOD RIGHT HAND  Final   Special Requests   Final    IN BOTH AEROBIC AND ANAEROBIC BOTTLES Blood Culture adequate volume   Culture   Final    NO GROWTH < 24 HOURS Performed at [facility], [address] [place], [place] [address]    Report Status PENDING  Incomplete  Culture, respiratory (NON-Expectorated)     Status: None (Preliminary result)   Collection Time: 11/01/17 10:27 AM  Result Value Ref Range Status   Specimen Description SPUTUM  Final   Special Requests NONE  Final   Gram Stain   Final    ABUNDANT WBC PRESENT, PREDOMINANTLY PMN NO SQUAMOUS EPITHELIAL CELLS SEEN FEW GRAM NEGATIVE COCCI IN PAIRS RARE GRAM POSITIVE COCCI IN PAIRS RARE GRAM NEGATIVE RODS Performed at [facility], [address] [place], [place] [address]    Culture PENDING  Incomplete   Report Status PENDING  Incomplete         Radiology Studies: Dg Chest 2 View  Result Date:  10/31/2017 CLINICAL DATA:  Pt was diagnosed with the flu on Friday 2/15. Since then she has developed a sever cough and SOB. Pt states that her right side hurts when she breaths in. EXAM: CHEST - 2 VIEW COMPARISON:  05/12/2017 FINDINGS: New airspace consolidation in basilar segments of the right lower lobe. Adjacent small pleural effusion is suspected. Left lung clear. Heart size and mediastinal contours are within normal limits. No pneumothorax. Visualized bones unremarkable. IMPRESSION: 1. Right lower lobe airspace disease suggesting pneumonia, with probable adjacent small effusion. Electronically Signed   By: Corlis Leak  Hassell M.D.   On: 10/31/2017 17:28   Dg Chest Port 1 View  Result Date: 11/02/2017 CLINICAL DATA:  Follow-up pneumonia EXAM: PORTABLE CHEST 1 VIEW COMPARISON:  10/31/2017 FINDINGS: Increase in amount of pleural fluid on the right, with associated atelectasis/infiltrate in the right lower lung. Mild patchy infiltrate/atelectasis at the left base  as well. Upper lobes are clear. IMPRESSION: Enlarging effusion on the right with worsened atelectasis/infiltrate in the right lower lung. New mild patchy density at the left base as well. Electronically Signed   By: Paulina Fusi M.D.   On: 11/02/2017 10:03        Scheduled Meds: . azithromycin  250 mg Oral Daily  . cefpodoxime  200 mg Oral Q12H  . enoxaparin (LOVENOX) injection  40 mg Subcutaneous Q24H   Continuous Infusions:   LOS: 2 days     Jacquelin Hawking, MD [facility] 11/02/2017, 11:10 AM Pager: [phone]  If 7PM-7AM, please contact night-coverage [email or web address] Password [email or web address] 11/02/2017, 11:10 AM

## 2017-11-02 NOTE — Progress Notes (Signed)
MD was notified of family having questions and the patient not understanding what is going on. I asked the doctor to speak to the family on the phone or if he was able to come to the room. MD said he was not coming to the room or speaking on the phone. Family is very upset I told them I would work on getting them the information they need.

## 2017-11-02 NOTE — Progress Notes (Signed)
SATURATION QUALIFICATIONS: (This note is used to comply with regulatory documentation for home oxygen)  Patient Saturations on Room Air at Rest = 88%  Patient Saturations on Room Air while Ambulating = 84%  Patient Saturations on 2 Liters of oxygen while Ambulating = 92%  Patient started breathing heavier when ambulating but tolerated the walk very well.

## 2017-11-03 ENCOUNTER — Inpatient Hospital Stay (HOSPITAL_COMMUNITY): Payer: BC Managed Care – PPO

## 2017-11-03 DIAGNOSIS — E039 Hypothyroidism, unspecified: Secondary | ICD-10-CM

## 2017-11-03 LAB — BASIC METABOLIC PANEL
Anion gap: 9 (ref 5–15)
BUN: 7 mg/dL (ref 6–20)
CALCIUM: 8.1 mg/dL — AB (ref 8.9–10.3)
CO2: 22 mmol/L (ref 22–32)
CREATININE: 0.63 mg/dL (ref 0.44–1.00)
Chloride: 105 mmol/L (ref 101–111)
GFR calc Af Amer: >60 mL/min (ref >60)
GFR calc non Af Amer: >60 mL/min (ref >60)
Glucose, Bld: 125 mg/dL — ABNORMAL HIGH (ref 65–99)
Potassium: 3.9 mmol/L (ref 3.5–5.1)
SODIUM: 136 mmol/L (ref 135–145)

## 2017-11-03 LAB — CBC WITH DIFFERENTIAL/PLATELET
Basophils Absolute: 0 10*3/uL (ref 0.0–0.1)
Basophils Relative: 0 %
EOS ABS: 0.2 10*3/uL (ref 0.0–0.7)
EOS PCT: 2 %
HCT: 34.6 % — ABNORMAL LOW (ref 36.0–46.0)
Hemoglobin: 11.4 g/dL — ABNORMAL LOW (ref 12.0–15.0)
LYMPHS ABS: 2.2 10*3/uL (ref 0.7–4.0)
Lymphocytes Relative: 24 %
MCH: 27.6 pg (ref 26.0–34.0)
MCHC: 32.9 g/dL (ref 30.0–36.0)
MCV: 83.8 fL (ref 78.0–100.0)
MONO ABS: 0.7 10*3/uL (ref 0.1–1.0)
Monocytes Relative: 8 %
Neutro Abs: 6.2 10*3/uL (ref 1.7–7.7)
Neutrophils Relative %: 66 %
PLATELETS: 472 10*3/uL — AB (ref 150–400)
RBC: 4.13 MIL/uL (ref 3.87–5.11)
RDW: 13.5 % (ref 11.5–15.5)
WBC: 9.3 10*3/uL (ref 4.0–10.5)

## 2017-11-03 LAB — CULTURE, RESPIRATORY W GRAM STAIN

## 2017-11-03 LAB — LEGIONELLA PNEUMOPHILA SEROGP 1 UR AG: L. PNEUMOPHILA SEROGP 1 UR AG: NEGATIVE

## 2017-11-03 LAB — CULTURE, RESPIRATORY

## 2017-11-03 LAB — MAGNESIUM: Magnesium: 2.2 mg/dL (ref 1.7–2.4)

## 2017-11-03 MED ORDER — GUAIFENESIN-DM 100-10 MG/5ML PO SYRP
5.0000 mL | ORAL_SOLUTION | ORAL | Status: DC | PRN
  Administered 2017-11-03: 5 mL via ORAL
  Filled 2017-11-03: qty 5

## 2017-11-03 MED ORDER — GUAIFENESIN-DM 100-10 MG/5ML PO SYRP: 5.0000 mL | ORAL_SOLUTION | ORAL | 0 refills | Status: DC | PRN

## 2017-11-03 MED ORDER — CEFPODOXIME PROXETIL 200 MG PO TABS: 200.0000 mg | ORAL_TABLET | Freq: Two times a day (BID) | ORAL | 0 refills | Status: AC

## 2017-11-03 MED ORDER — BENZONATATE 100 MG PO CAPS
200.0000 mg | ORAL_CAPSULE | Freq: Three times a day (TID) | ORAL | Status: DC
  Administered 2017-11-03: 200 mg via ORAL
  Filled 2017-11-03: qty 2

## 2017-11-03 MED ORDER — BENZONATATE 200 MG PO CAPS: 200.0000 mg | ORAL_CAPSULE | Freq: Three times a day (TID) | ORAL | 0 refills | Status: DC | PRN

## 2017-11-03 NOTE — Discharge Summary (Signed)
Physician Discharge Summary  Coralyn Roselli VOZ:[id number] DOB: 1950-12-18 DOA: 10/31/2017  PCP: Lorre Munroe, NP  Admit date: 10/31/2017 Discharge date: 11/03/2017  Admitted From: Home Disposition: Home  Recommendations for Outpatient Follow-up:  1. Follow up with PCP in 1 week 2. Please obtain BMP/CBC in one week 3. Repeat chest x-ray in 3-4 weeks 4. Please follow up on the following pending results: Sputum culture final result  Home Health: None Equipment/Devices: None  Discharge Condition: Stable CODE STATUS: Full code Diet recommendation: Heart healthy   Brief/Interim Summary:  Admission HPI written by Lorretta Harp, MD   Chief Complaint: Cough, fever, chills, shortness of breath, chest pain  HPI: Rachael Powell is a 67 y.o. female with medical history significant of hypothyroidism, GERD, depression, who presents with cough, fever, chills, shortness breath and chest pain.  Patient states that she was diagnosed with flu last week. She took Tamiflu for 4 days (until Sunday). She states that she continues to have fever, chills, cough. She coughs up greenish colored sputum. She has some mild SOB. Patient speaks in full sentence. She reports chest pain, which is located in the right lower chest, below the rib cage. The chest pain is constant, 10 out of 10 in severity, nonradiating, pleuritic, aggravated by coughing and deep breath. No tenderness in the calf areas. Patient states that she has nausea and vomited several times. She also had diarrhea in the past 2 or 3 days, which has resolved currently. Currently patient has nausea, no vomiting, diarrhea or abdominal pain. Denies symptoms of UTI or unilateral weakness.  ED Course: pt was found to have  WBC 23.8, lactic acid 1.31, negative troponin, electrolytes renal function okay, temperature 103, tachycardia, tachypnea, oxygen saturation 94% on room air. Chest x-ray showed infiltration in the right lower lobe. Patient is  admitted to telemetry bed as inpatient.    Hospital course:  RLL pneumonia Pleural effusion Patient on room air with tachypnea. Repeat chest x-ray ordered today and significant for worsening pleural effusion/infiltrate. Blood cultures negative to date. Sputum culture significant for Group A strep. MRSA PCR negative. Repeat chest x-ray stable. Discharge on Vantin, Flutter valve and tessalon perles. Patient will follow-up with urgent care and seek a PCP. Sputum culture sensitivities pending prior to discharge.  Rib pain Secondary to pneumonia/coughing. Continue analgesics prn    Discharge Diagnoses:  Principal Problem:   Lobar pneumonia (HCC) Active Problems:   Hypothyroidism   GERD (gastroesophageal reflux disease)   Sepsis [facility])    Discharge Instructions  Discharge Instructions    Call MD for:  temperature >100.4   Complete by:  As directed    Diet - low sodium heart healthy   Complete by:  As directed    Increase activity slowly   Complete by:  As directed      Allergies as of 11/03/2017   No Known Allergies     Medication List    STOP taking these medications   THERAFLU FLU/COLD/SORE THROAT PO     TAKE these medications   acetaminophen 500 MG tablet Commonly known as:  TYLENOL Take 500-1,000 mg by mouth every 6 (six) hours as needed (for pain, aches, or fever).   benzonatate 200 MG capsule Commonly known as:  TESSALON Take 1 capsule (200 mg total) by mouth 3 (three) times daily as needed for cough.   cefpodoxime 200 MG tablet Commonly known as:  VANTIN Take 1 tablet (200 mg total) by mouth every 12 (twelve) hours for 4 days.  guaiFENesin-dextromethorphan 100-10 MG/5ML syrup Commonly known as:  ROBITUSSIN DM Take 5 mLs by mouth every 4 (four) hours as needed for cough (chest congestion).      Follow-up Information    Primary care physician. Schedule an appointment as soon as possible for a visit in 1 week(s).   Why:  Hospital followup        [facility] [place]. Go to.   Why:  Follow-up if not able to obtain a PCP. Will need repeat chest x-ray. Contact information: [address] [place] [address] [phone]         No Known Allergies  Consultations:  None   Procedures/Studies: Dg Chest 2 View  Result Date: 11/02/2017 CLINICAL DATA:  Pleural effusion. EXAM: CHEST  2 VIEW COMPARISON:  November 02, 2017 FINDINGS: Right basilar infiltrate with associated effusion remains. The effusion is smaller in the interval. Tiny effusion and probable underlying atelectasis on the left is stable. No pneumothorax. The cardiomediastinal silhouette is stable. No other acute abnormalities. IMPRESSION: Persistent right basilar infiltrate with associated effusion. The effusion is smaller in the interval. Probable tiny effusion on the left with atelectasis. Electronically Signed   By: Gerome Sam III M.D   On: 11/02/2017 13:09   Dg Chest 2 View  Result Date: 10/31/2017 CLINICAL DATA:  Pt was diagnosed with the flu on Friday 2/15. Since then she has developed a sever cough and SOB. Pt states that her right side hurts when she breaths in. EXAM: CHEST - 2 VIEW COMPARISON:  05/12/2017 FINDINGS: New airspace consolidation in basilar segments of the right lower lobe. Adjacent small pleural effusion is suspected. Left lung clear. Heart size and mediastinal contours are within normal limits. No pneumothorax. Visualized bones unremarkable. IMPRESSION: 1. Right lower lobe airspace disease suggesting pneumonia, with probable adjacent small effusion. Electronically Signed   By: Corlis Leak M.D.   On: 10/31/2017 17:28   Dg Chest Right Decubitus  Result Date: 11/02/2017 CLINICAL DATA:  67 y.o. female with medical history significant of hypothyroidism, GERD, depression, who presents with productive cough, fever, chills, shortness breath and chest pain after having the flu a week prior EXAM: CHEST - RIGHT  DECUBITUS COMPARISON:  11/02/2017 at 9:19 a.m. FINDINGS: Left lateral decubitus radiograph demonstrates some layering right pleural fluid against the mediastinum. There is some streaky opacity at the right lung base that may reflect infection or atelectasis. There is no significant layering pleural fluid on the left. IMPRESSION: Layering right pleural effusion noted against the mediastinum on this left lateral decubitus study. Opacity at the right lung base may reflect atelectasis, pneumonia or a combination. No layering left pleural fluid. Electronically Signed   By: Amie Portland M.D.   On: 11/02/2017 13:46   Dg Chest Port 1 View  Result Date: 11/03/2017 CLINICAL DATA:  Pneumonia EXAM: PORTABLE CHEST 1 VIEW COMPARISON:  11/02/2017 FINDINGS: Persistent right basilar airspace disease. Small right pleural effusion. Left lung is clear. No pneumothorax. Stable cardiomediastinal silhouette. No acute osseous abnormality. IMPRESSION: 1. Small right pleural effusion. Right basilar airspace disease which may reflect atelectasis versus pneumonia. Followup PA and lateral chest X-ray is recommended in 3-4 weeks following trial of antibiotic therapy to ensure resolution and exclude underlying malignancy. Electronically Signed   By: Elige Ko   On: 11/03/2017 08:55   Dg Chest Port 1 View  Result Date: 11/02/2017 CLINICAL DATA:  Follow-up pneumonia EXAM: PORTABLE CHEST 1 VIEW COMPARISON:  10/31/2017 FINDINGS: Increase in amount  of pleural fluid on the right, with associated atelectasis/infiltrate in the right lower lung. Mild patchy infiltrate/atelectasis at the left base as well. Upper lobes are clear. IMPRESSION: Enlarging effusion on the right with worsened atelectasis/infiltrate in the right lower lung. New mild patchy density at the left base as well. Electronically Signed   By: Paulina Fusi M.D.   On: 11/02/2017 10:03      Subjective: Cough is productive. Pain with cough. Feels better from  yesterday.  Discharge Exam: Vitals:   11/02/17 2144 11/03/17 0529  BP: (!) 146/81 (!) 148/87  Pulse: 95 87  Resp: (!) 28 (!) 24  Temp: 99.6 F (37.6 C) 98.8 F (37.1 C)  SpO2: 92% 95%   Vitals:   11/02/17 0531 11/02/17 1602 11/02/17 2144 11/03/17 0529  BP: (!) 148/79 (!) 152/82 (!) 146/81 (!) 148/87  Pulse: 91 97 95 87  Resp: 16  (!) 28 (!) 24  Temp: 98.1 F (36.7 C) 98.7 F (37.1 C) 99.6 F (37.6 C) 98.8 F (37.1 C)  TempSrc:  Oral Oral Oral  SpO2: 94% 93% 92% 95%  Weight:      Height:        General: Pt is alert, awake, not in acute distress Cardiovascular: RRR, S1/S2 +, no rubs, no gallops Respiratory: Diminished bilaterally. No wheezing, no rhonchi Abdominal: Soft, NT, ND, bowel sounds + Extremities: no edema, no cyanosis    The results of significant diagnostics from this hospitalization (including imaging, microbiology, ancillary and laboratory) are listed below for reference.     Microbiology: Recent Results (from the past 240 hour(s))  Blood culture (routine x 2)     Status: None (Preliminary result)   Collection Time: 10/31/17  7:13 PM  Result Value Ref Range Status   Specimen Description BLOOD RIGHT ANTECUBITAL  Final   Special Requests   Final    IN BOTH AEROBIC AND ANAEROBIC BOTTLES Blood Culture adequate volume   Culture   Final    NO GROWTH 2 DAYS Performed at [facility], [address] [place], [place] [address]    Report Status PENDING  Incomplete  Blood culture (routine x 2)     Status: None (Preliminary result)   Collection Time: 10/31/17  7:18 PM  Result Value Ref Range Status   Specimen Description BLOOD RIGHT HAND  Final   Special Requests   Final    IN BOTH AEROBIC AND ANAEROBIC BOTTLES Blood Culture adequate volume   Culture   Final    NO GROWTH 2 DAYS Performed at [facility], [address] [place], [place] [address]    Report Status PENDING  Incomplete  Culture, respiratory (NON-Expectorated)     Status: None  (Preliminary result)   Collection Time: 11/01/17 10:27 AM  Result Value Ref Range Status   Specimen Description SPUTUM  Final   Special Requests NONE  Final   Gram Stain   Final    ABUNDANT WBC PRESENT, PREDOMINANTLY PMN NO SQUAMOUS EPITHELIAL CELLS SEEN FEW GRAM NEGATIVE COCCI IN PAIRS RARE GRAM POSITIVE COCCI IN PAIRS RARE GRAM NEGATIVE RODS Performed at [facility], [address] [place], [place] [address]    Culture FEW GROUP A STREP (S.PYOGENES) ISOLATED  Final   Report Status PENDING  Incomplete  MRSA PCR Screening     Status: None   Collection Time: 11/02/17  4:11 PM  Result Value Ref Range Status   MRSA by PCR NEGATIVE NEGATIVE Final    Comment:  The GeneXpert MRSA Assay (FDA approved for NASAL specimens only), is one component of a comprehensive MRSA colonization surveillance program. It is not intended to diagnose MRSA infection nor to guide or monitor treatment for MRSA infections. Performed at [facility], [address] [place], [place] [address]      Labs: BNP (last 3 results) No results for input(s): BNP in the last 8760 hours. Basic Metabolic Panel: Recent Labs  Lab 10/31/17 1645 11/02/17 0257 11/03/17 0531  NA 132* 137 136  K 4.1 3.8 3.9  CL 99* 106 105  CO2 20* 19* 22  GLUCOSE 156* 122* 125*  BUN 8 6 7   CREATININE 0.96 0.71 0.63  CALCIUM 8.6* 7.6* 8.1*  MG  --  2.0 2.2   Liver Function Tests: Recent Labs  Lab 10/31/17 1645  AST 63*  ALT 98*  ALKPHOS 140*  BILITOT 1.0  PROT 7.9  ALBUMIN 3.0*   No results for input(s): LIPASE, AMYLASE in the last 168 hours. No results for input(s): AMMONIA in the last 168 hours. CBC: Recent Labs  Lab 10/31/17 1645 11/02/17 0257 11/03/17 0531  WBC 23.8* 13.7* 9.3  NEUTROABS 20.0* 10.2* 6.2  HGB 12.5 11.1* 11.4*  HCT 37.3 34.4* 34.6*  MCV 83.8 85.4 83.8  PLT 514* 468* 472*   Cardiac Enzymes: No results for input(s): CKTOTAL, CKMB, CKMBINDEX, TROPONINI in the last 168  hours. BNP: Invalid input(s): POCBNP CBG: No results for input(s): GLUCAP in the last 168 hours. D-Dimer No results for input(s): DDIMER in the last 72 hours. Hgb A1c No results for input(s): HGBA1C in the last 72 hours. Lipid Profile No results for input(s): CHOL, HDL, LDLCALC, TRIG, CHOLHDL, LDLDIRECT in the last 72 hours. Thyroid function studies No results for input(s): TSH, T4TOTAL, T3FREE, THYROIDAB in the last 72 hours.  Invalid input(s): FREET3 Anemia work up No results for input(s): VITAMINB12, FOLATE, FERRITIN, TIBC, IRON, RETICCTPCT in the last 72 hours. Urinalysis    Component Value Date/Time   COLORURINE AMBER (A) 10/31/2017 2008   APPEARANCEUR HAZY (A) 10/31/2017 2008   LABSPEC 1.028 10/31/2017 2008   PHURINE 5.0 10/31/2017 2008   GLUCOSEU NEGATIVE 10/31/2017 2008   HGBUR NEGATIVE 10/31/2017 2008   BILIRUBINUR NEGATIVE 10/31/2017 2008   BILIRUBINUR neg 09/06/2013 1018   KETONESUR 5 (A) 10/31/2017 2008   PROTEINUR 100 (A) 10/31/2017 2008   UROBILINOGEN 1.0 09/06/2013 1018   NITRITE NEGATIVE 10/31/2017 2008   LEUKOCYTESUR NEGATIVE 10/31/2017 2008   Sepsis Labs Invalid input(s): PROCALCITONIN,  WBC,  LACTICIDVEN Microbiology Recent Results (from the past 240 hour(s))  Blood culture (routine x 2)     Status: None (Preliminary result)   Collection Time: 10/31/17  7:13 PM  Result Value Ref Range Status   Specimen Description BLOOD RIGHT ANTECUBITAL  Final   Special Requests   Final    IN BOTH AEROBIC AND ANAEROBIC BOTTLES Blood Culture adequate volume   Culture   Final    NO GROWTH 2 DAYS Performed at [facility], [address] [place], [place] [address]    Report Status PENDING  Incomplete  Blood culture (routine x 2)     Status: None (Preliminary result)   Collection Time: 10/31/17  7:18 PM  Result Value Ref Range Status   Specimen Description BLOOD RIGHT HAND  Final   Special Requests   Final    IN BOTH AEROBIC AND ANAEROBIC BOTTLES Blood  Culture adequate volume   Culture   Final  NO GROWTH 2 DAYS Performed at [facility], [address], [place], [place] [address]    Report Status PENDING  Incomplete  Culture, respiratory (NON-Expectorated)     Status: None (Preliminary result)   Collection Time: 11/01/17 10:27 AM  Result Value Ref Range Status   Specimen Description SPUTUM  Final   Special Requests NONE  Final   Gram Stain   Final    ABUNDANT WBC PRESENT, PREDOMINANTLY PMN NO SQUAMOUS EPITHELIAL CELLS SEEN FEW GRAM NEGATIVE COCCI IN PAIRS RARE GRAM POSITIVE COCCI IN PAIRS RARE GRAM NEGATIVE RODS Performed at [facility], [address], [place], [place] [address]    Culture FEW GROUP A STREP (S.PYOGENES) ISOLATED  Final   Report Status PENDING  Incomplete  MRSA PCR Screening     Status: None   Collection Time: 11/02/17  4:11 PM  Result Value Ref Range Status   MRSA by PCR NEGATIVE NEGATIVE Final    Comment:        The GeneXpert MRSA Assay (FDA approved for NASAL specimens only), is one component of a comprehensive MRSA colonization surveillance program. It is not intended to diagnose MRSA infection nor to guide or monitor treatment for MRSA infections. Performed at [facility], [address] [place], [place] [address]      SIGNED:   Jacquelin Hawking, MD [facility] 11/03/2017, 1:32 PM Pager [phone]  If 7PM-7AM, please contact night-coverage [email or web address] Password [email or web address]

## 2017-11-03 NOTE — Discharge Instructions (Signed)

## 2017-11-03 NOTE — Care Management Note (Signed)
Case Management Note  Patient Details  Name: Rachael Powell MRN: [id number] Date of Birth: 03/07/1951  Subjective/Objective:   Admitted with PNA.                  Action/Plan: Transition to home. GoodRx card given to offset medication cost. Patient without prescription coverage.  Daughter to provide transportation to home.  Expected Discharge Date:  11/03/17               Expected Discharge Plan:  Home/Self Care  In-House Referral:     Discharge planning Services  CM Consult   Status of Service:  completed  If discussed at Long Length of Stay Meetings, dates discussed:    Additional Comments:  Epifanio LeschesCole, [name], RN 11/03/2017, 2:39 PM

## 2017-11-03 NOTE — Progress Notes (Signed)
Patient discharge teaching given, including activity, diet, follow-up appoints, and medications. Patient verbalized understanding of all discharge instructions. IV access was d/c'd. Vitals are stable. Skin is intact except as charted in most recent assessments. Pt to be escorted out by NT, to be driven home by family.  Pt. Concerned about affording medicines, referred to Case manager. All issues addressed.

## 2017-11-03 NOTE — Progress Notes (Signed)
Ambulated patient oxygenation saturations on room air sating at 95% lying and 99% while walking. No need for oxygen. MD notified.

## 2017-11-03 NOTE — Progress Notes (Signed)
PROGRESS NOTE    Rachael Powell  ZOX:[id number] DOB: [birth year] DOA: 10/31/2017 PCP: Lorre Munroe, NP   Brief Narrative: Rachael Powell is a 67 y.o. femalewith medical history significant ofhypothyroidism, GERD, depression, who presents with productive cough, fever, chills, shortness breath and chest pain after having the flu a week prior. Found to have RLL infiltrate on CXR, consistent with CAP. Also with worsening pleural effusion which is likely related to infection. Also, possible new developing LL infiltrate.   Assessment & Plan:   Principal Problem:   Lobar pneumonia (HCC) Active Problems:   Hypothyroidism   GERD (gastroesophageal reflux disease)   Sepsis (HCC)   RLL pneumonia Pleural effusion Patient on room air with tachypnea. Repeat chest x-ray ordered today and significant for worsening pleural effusion/infiltrate. Blood cultures negative to date. Sputum culture significant for Group A strep. MRSA PCR negative. Repeat chest x-ray stable. -Continue Vantin -Sputum culture sensitivities pending -Flutter valve -Tessalon perles  Rib pain Secondary to pneumonia/coughing -Continue analgesics prn  GERD Per patient, she does not have GERD. -Discontinue protonix   DVT prophylaxis: Lovneox Code Status:   Code Status: Full Code Family Communication: Daughter at bedside Disposition Plan: Discharge when medically stable   Consultants:   None  Procedures:   None  Antimicrobials:  Ceftriaxone (2/22>>2/23)  Azithromycin (2/22>>  Vantin (2/24>>   Subjective: Chest pain persistent. Cough with infrequent sputum production.  Objective: Vitals:   11/02/17 0531 11/02/17 1602 11/02/17 2144 11/03/17 0529  BP: (!) 148/79 (!) 152/82 (!) 146/81 (!) 148/87  Pulse: 91 97 95 87  Resp: 16  (!) 28 (!) 24  Temp: 98.1 F (36.7 C) 98.7 F (37.1 C) 99.6 F (37.6 C) 98.8 F (37.1 C)  TempSrc:  Oral Oral Oral  SpO2: 94% 93% 92% 95%  Weight:      Height:        No intake or output data in the 24 hours ending 11/03/17 1051 Filed Weights   11/01/17 1410  Weight: 78.9 kg (174 lb)    Examination:  General exam: Appears calm and comfortable  Respiratory system: Diminished at bases. No tachypnea. Able to speak in complete sentences Cardiovascular system: S1 & S2 heard, RRR. No murmurs. Gastrointestinal system: Abdomen is nondistended, soft and nontender. No organomegaly or masses felt. Normal bowel sounds heard. Central nervous system: Alert and oriented. No focal neurological deficits. Extremities: No edema. No calf tenderness Skin: No cyanosis. No rashes Psychiatry: Judgement and insight appear normal. Mood & affect appropriate.     Data Reviewed: I have personally reviewed following labs and imaging studies  CBC: Recent Labs  Lab 10/31/17 1645 11/02/17 0257 11/03/17 0531  WBC 23.8* 13.7* 9.3  NEUTROABS 20.0* 10.2* 6.2  HGB 12.5 11.1* 11.4*  HCT 37.3 34.4* 34.6*  MCV 83.8 85.4 83.8  PLT 514* 468* 472*   Basic Metabolic Panel: Recent Labs  Lab 10/31/17 1645 11/02/17 0257 11/03/17 0531  NA 132* 137 136  K 4.1 3.8 3.9  CL 99* 106 105  CO2 20* 19* 22  GLUCOSE 156* 122* 125*  BUN 8 6 7   CREATININE 0.96 0.71 0.63  CALCIUM 8.6* 7.6* 8.1*  MG  --  2.0 2.2   GFR: Estimated Creatinine Clearance: 68.4 mL/min (by C-G formula based on SCr of 0.63 mg/dL). Liver Function Tests: Recent Labs  Lab 10/31/17 1645  AST 63*  ALT 98*  ALKPHOS 140*  BILITOT 1.0  PROT 7.9  ALBUMIN 3.0*   No results for input(s): LIPASE, AMYLASE  in the last 168 hours. No results for input(s): AMMONIA in the last 168 hours. Coagulation Profile: No results for input(s): INR, PROTIME in the last 168 hours. Cardiac Enzymes: No results for input(s): CKTOTAL, CKMB, CKMBINDEX, TROPONINI in the last 168 hours. BNP (last 3 results) No results for input(s): PROBNP in the last 8760 hours. HbA1C: No results for input(s): HGBA1C in the last 72  hours. CBG: No results for input(s): GLUCAP in the last 168 hours. Lipid Profile: No results for input(s): CHOL, HDL, LDLCALC, TRIG, CHOLHDL, LDLDIRECT in the last 72 hours. Thyroid Function Tests: No results for input(s): TSH, T4TOTAL, FREET4, T3FREE, THYROIDAB in the last 72 hours. Anemia Panel: No results for input(s): VITAMINB12, FOLATE, FERRITIN, TIBC, IRON, RETICCTPCT in the last 72 hours. Sepsis Labs: Recent Labs  Lab 10/31/17 1858 10/31/17 2027  PROCALCITON  --  0.36  LATICACIDVEN 1.31 1.3    Recent Results (from the past 240 hour(s))  Blood culture (routine x 2)     Status: None (Preliminary result)   Collection Time: 10/31/17  7:13 PM  Result Value Ref Range Status   Specimen Description BLOOD RIGHT ANTECUBITAL  Final   Special Requests   Final    IN BOTH AEROBIC AND ANAEROBIC BOTTLES Blood Culture adequate volume   Culture   Final    NO GROWTH 2 DAYS Performed at [facility], [address] [place], [place] [address]    Report Status PENDING  Incomplete  Blood culture (routine x 2)     Status: None (Preliminary result)   Collection Time: 10/31/17  7:18 PM  Result Value Ref Range Status   Specimen Description BLOOD RIGHT HAND  Final   Special Requests   Final    IN BOTH AEROBIC AND ANAEROBIC BOTTLES Blood Culture adequate volume   Culture   Final    NO GROWTH 2 DAYS Performed at [facility], [address] [place], [place] [address]    Report Status PENDING  Incomplete  Culture, respiratory (NON-Expectorated)     Status: None (Preliminary result)   Collection Time: 11/01/17 10:27 AM  Result Value Ref Range Status   Specimen Description SPUTUM  Final   Special Requests NONE  Final   Gram Stain   Final    ABUNDANT WBC PRESENT, PREDOMINANTLY PMN NO SQUAMOUS EPITHELIAL CELLS SEEN FEW GRAM NEGATIVE COCCI IN PAIRS RARE GRAM POSITIVE COCCI IN PAIRS RARE GRAM NEGATIVE RODS Performed at [facility], [address] [place], [place]  [address]    Culture FEW GROUP A STREP (S.PYOGENES) ISOLATED  Final   Report Status PENDING  Incomplete  MRSA PCR Screening     Status: None   Collection Time: 11/02/17  4:11 PM  Result Value Ref Range Status   MRSA by PCR NEGATIVE NEGATIVE Final    Comment:        The GeneXpert MRSA Assay (FDA approved for NASAL specimens only), is one component of a comprehensive MRSA colonization surveillance program. It is not intended to diagnose MRSA infection nor to guide or monitor treatment for MRSA infections. Performed at [facility], [address] [place], [place] [address]          Radiology Studies: Dg Chest 2 View  Result Date: 11/02/2017 CLINICAL DATA:  Pleural effusion. EXAM: CHEST  2 VIEW COMPARISON:  November 02, 2017 FINDINGS: Right basilar infiltrate with associated effusion remains. The effusion is smaller in the interval. Tiny effusion and probable underlying atelectasis on  the left is stable. No pneumothorax. The cardiomediastinal silhouette is stable. No other acute abnormalities. IMPRESSION: Persistent right basilar infiltrate with associated effusion. The effusion is smaller in the interval. Probable tiny effusion on the left with atelectasis. Electronically Signed   By: Gerome Sam III M.D   On: 11/02/2017 13:09   Dg Chest Right Decubitus  Result Date: 11/02/2017 CLINICAL DATA:  67 y.o. female with medical history significant of hypothyroidism, GERD, depression, who presents with productive cough, fever, chills, shortness breath and chest pain after having the flu a week prior EXAM: CHEST - RIGHT DECUBITUS COMPARISON:  11/02/2017 at 9:19 a.m. FINDINGS: Left lateral decubitus radiograph demonstrates some layering right pleural fluid against the mediastinum. There is some streaky opacity at the right lung base that may reflect infection or atelectasis. There is no significant layering pleural fluid on the left. IMPRESSION: Layering right pleural effusion noted  against the mediastinum on this left lateral decubitus study. Opacity at the right lung base may reflect atelectasis, pneumonia or a combination. No layering left pleural fluid. Electronically Signed   By: Amie Portland M.D.   On: 11/02/2017 13:46   Dg Chest Port 1 View  Result Date: 11/03/2017 CLINICAL DATA:  Pneumonia EXAM: PORTABLE CHEST 1 VIEW COMPARISON:  11/02/2017 FINDINGS: Persistent right basilar airspace disease. Small right pleural effusion. Left lung is clear. No pneumothorax. Stable cardiomediastinal silhouette. No acute osseous abnormality. IMPRESSION: 1. Small right pleural effusion. Right basilar airspace disease which may reflect atelectasis versus pneumonia. Followup PA and lateral chest X-ray is recommended in 3-4 weeks following trial of antibiotic therapy to ensure resolution and exclude underlying malignancy. Electronically Signed   By: Elige Ko   On: 11/03/2017 08:55   Dg Chest Port 1 View  Result Date: 11/02/2017 CLINICAL DATA:  Follow-up pneumonia EXAM: PORTABLE CHEST 1 VIEW COMPARISON:  10/31/2017 FINDINGS: Increase in amount of pleural fluid on the right, with associated atelectasis/infiltrate in the right lower lung. Mild patchy infiltrate/atelectasis at the left base as well. Upper lobes are clear. IMPRESSION: Enlarging effusion on the right with worsened atelectasis/infiltrate in the right lower lung. New mild patchy density at the left base as well. Electronically Signed   By: Paulina Fusi M.D.   On: 11/02/2017 10:03        Scheduled Meds: . benzonatate  200 mg Oral TID  . cefpodoxime  200 mg Oral Q12H  . enoxaparin (LOVENOX) injection  40 mg Subcutaneous Q24H   Continuous Infusions:   LOS: 3 days     Jacquelin Hawking, MD [facility] 11/03/2017, 10:51 AM Pager: [phone]  If 7PM-7AM, please contact night-coverage [email or web address] Password [facility] 11/03/2017, 10:51 AM

## 2017-11-03 NOTE — Care Management Note (Signed)
Case Management Note  Patient Details  Name: Rachael ErikssonRochelle Diane Powell MRN: [id number] Date of Birth: 08/02/1951  Subjective/Objective:        Admitted with PNA, hx of hypothyroidism, GERD, depression. Live alone. PTA independent with ADL's, no DME usage.       PCP: Dr.Regina Kathreen CornfieldBaitey     Yakima Fox (Daughter)     [phone]       Action/Plan: Transition to home when medically stable . CM following for disposition needs.  Expected Discharge Date:  11/04/17               Expected Discharge Plan:  Home/Self Care  In-House Referral:     Discharge planning Services  CM Consult  Post Acute Care Choice:    Choice offered to:     DME Arranged:    DME Agency:     HH Arranged:    HH Agency:     Status of Service:  In process, will continue to follow  If discussed at Long Length of Stay Meetings, dates discussed:    Additional Comments:  Epifanio LeschesCole, Britney Captain Hudson, RN 11/03/2017, 11:32 AM

## 2017-11-05 ENCOUNTER — Telehealth: Payer: Self-pay

## 2017-11-05 LAB — CULTURE, BLOOD (ROUTINE X 2)
CULTURE: NO GROWTH
CULTURE: NO GROWTH

## 2017-11-05 NOTE — Telephone Encounter (Signed)
Called patient to discuss hospital follow up appointment as triggered by TCM report.  However, patient has not been seen here since 2014 and appears to have been seen at multiple other locations despite Nicki Reaperegina Baity, NP being listed as PCP.  Nicki Reaperegina Baity, NP,  Is this your patient?  Appears patient needs hospital f/u labs in 1 week.  I have LM for patient to call back and discuss follow up.  Let us know how we should proceed.    Thanks.

## 2017-11-06 NOTE — Telephone Encounter (Signed)
If she hasn't been seen here since 2014, no longer my patient

## 2017-11-10 ENCOUNTER — Other Ambulatory Visit: Payer: Self-pay

## 2017-11-10 ENCOUNTER — Ambulatory Visit (INDEPENDENT_AMBULATORY_CARE_PROVIDER_SITE_OTHER): Payer: BC Managed Care – PPO

## 2017-11-10 ENCOUNTER — Ambulatory Visit (HOSPITAL_COMMUNITY)
Admission: EM | Admit: 2017-11-10 | Discharge: 2017-11-10 | Disposition: A | Discharge status: Home / Self Care | Payer: BC Managed Care – PPO | Attending: Emergency Medicine | Admitting: Emergency Medicine

## 2017-11-10 ENCOUNTER — Encounter (HOSPITAL_COMMUNITY): Payer: Self-pay | Admitting: Emergency Medicine

## 2017-11-10 DIAGNOSIS — Z09 Encounter for follow-up examination after completed treatment for conditions other than malignant neoplasm: Secondary | ICD-10-CM | POA: Diagnosis not present

## 2017-11-10 DIAGNOSIS — J181 Lobar pneumonia, unspecified organism: Secondary | ICD-10-CM

## 2017-11-10 DIAGNOSIS — J189 Pneumonia, unspecified organism: Secondary | ICD-10-CM | POA: Insufficient documentation

## 2017-11-10 DIAGNOSIS — R05 Cough: Secondary | ICD-10-CM

## 2017-11-10 DIAGNOSIS — R0602 Shortness of breath: Secondary | ICD-10-CM

## 2017-11-10 LAB — POCT I-STAT, CHEM 8
BUN: 14 mg/dL (ref 6–20)
CREATININE: 0.7 mg/dL (ref 0.44–1.00)
Calcium, Ion: 1.17 mmol/L (ref 1.15–1.40)
Chloride: 102 mmol/L (ref 101–111)
GLUCOSE: 244 mg/dL — AB (ref 65–99)
HCT: 39 % (ref 36.0–46.0)
Hemoglobin: 13.3 g/dL (ref 12.0–15.0)
Potassium: 4 mmol/L (ref 3.5–5.1)
Sodium: 138 mmol/L (ref 135–145)
TCO2: 24 mmol/L (ref 22–32)

## 2017-11-10 LAB — CBC
HCT: 37 % (ref 36.0–46.0)
HEMOGLOBIN: 11.7 g/dL — AB (ref 12.0–15.0)
MCH: 27.7 pg (ref 26.0–34.0)
MCHC: 31.6 g/dL (ref 30.0–36.0)
MCV: 87.5 fL (ref 78.0–100.0)
Platelets: 664 10*3/uL — ABNORMAL HIGH (ref 150–400)
RBC: 4.23 MIL/uL (ref 3.87–5.11)
RDW: 14.2 % (ref 11.5–15.5)
WBC: 5 10*3/uL (ref 4.0–10.5)

## 2017-11-10 MED ORDER — PSEUDOEPH-BROMPHEN-DM 30-2-10 MG/5ML PO SYRP: 5.0000 mL | ORAL_SOLUTION | Freq: Four times a day (QID) | ORAL | 0 refills | Status: AC | PRN

## 2017-11-10 NOTE — ED Provider Notes (Addendum)
[facility]    CSN: [id number] Arrival date & time: 11/10/17  1003     History   Chief Complaint Chief Complaint  Patient presents with  . Follow-up    HPI Rachael Powell is a 67 y.o. female presenting for follow-up of pneumonia.  She was admitted to the hospital 1 week ago for pneumonia.  She finished her course of antibiotics 3 days ago.  She states that since she has been discharged she has felt like she is improving, although she does still feel short of breath with exertion, and is still coughing.  She has been using Tessalon for cough, states that this is helped minimal.  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  HPI  Past Medical History:  Diagnosis Date  . Chicken pox   . Chronic kidney disease    right renal stone  . Colon polyps   . Depression    no medication  . Frequent headaches   . GERD (gastroesophageal reflux disease)   . Hypothyroidism    possible low thyroid. No meds  . Thyroid disease     Patient Active Problem List   Diagnosis Date Noted  . Lobar pneumonia (HCC) 10/31/2017    Past Surgical History:  Procedure Laterality Date  . DILATION AND CURETTAGE OF UTERUS    . HAMMER TOE SURGERY    . TUBAL LIGATION      OB History    No data available       Home Medications    Prior to Admission medications   Medication Sig Start Date End Date Taking? Authorizing Provider  benzonatate (TESSALON) 200 MG capsule Take 1 capsule (200 mg total) by mouth 3 (three) times daily as needed for cough. 11/03/17  Yes Narda Bonds, MD  guaiFENesin-dextromethorphan (ROBITUSSIN DM) 100-10 MG/5ML syrup Take 5 mLs by mouth every 4 (four) hours as needed for cough (chest congestion). 11/03/17  Yes Narda Bonds, MD  acetaminophen (TYLENOL) 500 MG tablet Take 500-1,000 mg by mouth every 6 (six) hours as needed (for pain, aches, or fever).     [provider]    brompheniramine-pseudoephedrine-DM 30-2-10 MG/5ML syrup Take 5 mLs by mouth 4 (four) times daily as needed for up to 5 days. 11/10/17 11/15/17  Wieters, Junius Creamer, PA-C    Family History Family History  Problem Relation Age of Onset  . Diabetes Mother   . Diabetes Father     Social History Social History   Tobacco Use  . Smoking status: Never Smoker  . Smokeless tobacco: Never Used  Substance Use Topics  . Alcohol use: No  . Drug use: No     Allergies   Patient has no known allergies.   Review of Systems Review of Systems  Constitutional: Negative for chills, fatigue and fever.  HENT: Negative for congestion, ear pain, rhinorrhea, sinus pressure, sore throat and trouble swallowing.   Respiratory: Positive for cough and shortness of breath. Negative for chest tightness.   Cardiovascular: Negative for chest pain.  Gastrointestinal: Negative for abdominal pain, nausea and vomiting.  Musculoskeletal: Negative for myalgias.  Skin: Negative for rash.  Neurological: Negative for dizziness, light-headedness and headaches.     Physical Exam Triage Vital Signs ED Triage Vitals [11/10/17 1037]  Enc Vitals Group     BP 129/81     Pulse Rate 92     Resp 18     Temp 98.6 F (37 C)  Temp Source Oral     SpO2 98 %     Weight      Height      Head Circumference      Peak Flow      Pain Score      Pain Loc      Pain Edu?      Excl. in GC?    No data found.  Updated Vital Signs BP 129/81 (BP Location: Right Arm)   Pulse 92   Temp 98.6 F (37 C) (Oral)   Resp 18   SpO2 98%   Visual Acuity Right Eye Distance:   Left Eye Distance:   Bilateral Distance:    Right Eye Near:   Left Eye Near:    Bilateral Near:     Physical Exam  Constitutional: She appears well-developed and well-nourished. No distress.  HENT:  Head: Normocephalic and atraumatic.  Bilateral TMs nonerythematous, nasal mucosa erythematous without rhinorrhea.  Posterior oropharynx minimally  erythematous, no tonsillar enlargement or exudate.  Eyes: Conjunctivae are normal.  Neck: Neck supple.  Cardiovascular: Normal rate and regular rhythm.  No murmur heard. Pulmonary/Chest: Effort normal and breath sounds normal. No respiratory distress.  Breathing comfortably at rest  Abdominal: Soft. There is no tenderness.  Musculoskeletal: She exhibits no edema.  Neurological: She is alert.  Skin: Skin is warm and dry.  Psychiatric: She has a normal mood and affect.  Nursing note and vitals reviewed.    UC Treatments / Results  Labs (all labs ordered are listed, but only abnormal results are displayed) Labs Reviewed  POCT I-STAT, CHEM 8 - Abnormal; Notable for the following components:      Result Value   Glucose, Bld 244 (*)    All other components within normal limits  CBC    EKG  EKG Interpretation None       Radiology Dg Chest 2 View  Result Date: 11/10/2017 CLINICAL DATA:  Dry cough EXAM: CHEST  2 VIEW COMPARISON:  10/14/17 FINDINGS: Normal cardiac silhouette. Linear scarring at the RIGHT lung base. Improved effusion from comparison exam. Lungs are hyperinflated. No pneumothorax. IMPRESSION: 1. Improved RIGHT pleural effusion. 2. Persistent band of RIGHT lower lobe atelectasis. Cannot exclude RIGHT lower lobe infiltrate. Electronically Signed   By: Genevive Bi M.D.   On: 11/10/2017 11:24    Procedures Procedures (including critical care time)  Medications Ordered in UC Medications - No data to display   Initial Impression / Assessment and Plan / UC Course  I have reviewed the triage vital signs and the nursing notes.  Pertinent labs & imaging results that were available during my care of the patient were reviewed by me and considered in my medical decision making (see chart for details).     Chest x-ray with improved pleural effusion, persistent infiltrate.  Patient overall improving although does still have short of breath.  We will treat her cough,  expect continued improvement.  Advised to return if symptoms changing or worsening and not continuing to improve.  BMP relatively normal, glucose elevated.  CBC obtained.  Will call if abnormal or not improving as expected.  Discussed strict return precautions. Patient verbalized understanding and is agreeable with plan.   Final Clinical Impressions(s) / UC Diagnoses   Final diagnoses:  Follow up  Community acquired pneumonia of right lower lobe of lung [facility])    ED Discharge Orders        Ordered    brompheniramine-pseudoephedrine-DM 30-2-10 MG/5ML syrup  4 times  daily PRN     11/10/17 1112       Controlled Substance Prescriptions [place] Controlled Substance Registry consulted? Not Applicable   Lew DawesWieters, Hallie C, PA-C 11/10/17 1159    Wieters, CathcartHallie C, PA-C 11/10/17 1200

## 2017-11-10 NOTE — Telephone Encounter (Signed)
Noted.  This is not our patient.

## 2017-11-10 NOTE — Discharge Instructions (Signed)
You may try new cough syrup as an alternative for cough relief to Tessalon.  Please drink plenty of fluids.   We will call if anything looks abnormal on your blood tests.   Please return if you feel your symptoms are worsening, changing, cough not improving in 1-2 weeks.

## 2017-11-10 NOTE — ED Triage Notes (Signed)
Pt is here for hospital f/u from one week ago.  Pt was admitted for pneumonia.  She still reports a small cough, and that she will get SOB with activity.  She denies fever.

## 2017-11-13 ENCOUNTER — Telehealth: Payer: Self-pay | Admitting: Nurse Practitioner

## 2017-11-13 NOTE — Telephone Encounter (Signed)
error 

## 2017-12-03 ENCOUNTER — Encounter: Payer: Self-pay | Admitting: Neurology

## 2017-12-03 ENCOUNTER — Ambulatory Visit: Payer: BC Managed Care – PPO | Admitting: Neurology

## 2017-12-03 VITALS — BP 128/72 | HR 83 | Resp 16 | Ht 64.0 in | Wt 168.0 lb

## 2017-12-03 DIAGNOSIS — G4486 Cervicogenic headache: Secondary | ICD-10-CM

## 2017-12-03 DIAGNOSIS — R51 Headache: Secondary | ICD-10-CM | POA: Diagnosis not present

## 2017-12-03 MED ORDER — TIZANIDINE HCL 2 MG PO TABS: 2.0000 mg | ORAL_TABLET | Freq: Every day | ORAL | 2 refills | Status: DC

## 2017-12-03 NOTE — Patient Instructions (Signed)
1.   Start tizanidine 2mg  at bedtime.  If headache not improved in 2 weeks, increase to 2 tablets (4mg  total) at bedtime.  If still not effective, contact me via MyChart or phone and we can increase dose further or switch to gabapentin. 2.  Follow up in 3 months.
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
## 2017-12-30 ENCOUNTER — Ambulatory Visit: Payer: BC Managed Care – PPO | Admitting: Nurse Practitioner

## 2017-12-30 ENCOUNTER — Other Ambulatory Visit: Payer: Self-pay | Admitting: Family Medicine

## 2018-01-05 ENCOUNTER — Encounter: Payer: Self-pay | Admitting: Neurology

## 2018-02-18 ENCOUNTER — Ambulatory Visit: Payer: Medicare Other | Admitting: Nurse Practitioner

## 2018-03-13 ENCOUNTER — Ambulatory Visit: Payer: BC Managed Care – PPO | Admitting: Neurology

## 2018-06-30 ENCOUNTER — Ambulatory Visit: Payer: BC Managed Care – PPO | Admitting: Family Medicine

## 2018-06-30 ENCOUNTER — Other Ambulatory Visit: Payer: Self-pay

## 2018-06-30 ENCOUNTER — Encounter: Payer: Self-pay | Admitting: Family Medicine

## 2018-06-30 VITALS — BP 147/84 | HR 82 | Temp 97.8°F | Resp 16 | Ht 64.0 in | Wt 170.8 lb

## 2018-06-30 DIAGNOSIS — H5461 Unqualified visual loss, right eye, normal vision left eye: Secondary | ICD-10-CM | POA: Insufficient documentation

## 2018-06-30 DIAGNOSIS — M545 Low back pain: Secondary | ICD-10-CM | POA: Diagnosis not present

## 2018-06-30 DIAGNOSIS — G44209 Tension-type headache, unspecified, not intractable: Secondary | ICD-10-CM

## 2018-06-30 DIAGNOSIS — G8929 Other chronic pain: Secondary | ICD-10-CM | POA: Diagnosis not present

## 2018-06-30 NOTE — Patient Instructions (Addendum)
If you have lab work done today you will be contacted with your lab results within the next 2 weeks.  If you have not heard from [place] then please contact us. The fastest way to get your results is to register for My Chart.   IF you received an x-ray today, you will receive an invoice from [facility]. Please contact [facility] at [phone] with questions or concerns regarding your invoice.   IF you received labwork today, you will receive an invoice from [facility]. Please contact [facility] at [phone] with questions or concerns regarding your invoice.   Our billing staff will not be able to assist you with questions regarding bills from these companies.  You will be contacted with the lab results as soon as they are available. The fastest way to get your results is to activate your My Chart account. Instructions are located on the last page of this paperwork. If you have not heard from [place] regarding the results in 2 weeks, please contact this office.      Tension Headache A tension headache is a feeling of pain, pressure, or aching that is often felt over the front and sides of the head. The pain can be dull, or it can feel tight (constricting). Tension headaches are not normally associated with nausea or vomiting, and they do not get worse with physical activity. Tension headaches can last from 30 minutes to several days. This is the most common type of headache. CAUSES The exact cause of this condition is not known. Tension headaches often begin after stress, anxiety, or depression. Other triggers may include:  Alcohol.  Too much caffeine, or caffeine withdrawal.  Respiratory infections, such as colds, flu, or sinus infections.  Dental problems or teeth clenching.  Fatigue.  Holding your head and neck in the same position for a long period of time, such as while using a computer.  Smoking. SYMPTOMS Symptoms of this condition include:  A feeling of  pressure around the head.  Dull, aching head pain.  Pain felt over the front and sides of the head.  Tenderness in the muscles of the head, neck, and shoulders. DIAGNOSIS This condition may be diagnosed based on your symptoms and a physical exam. Tests may be done, such as a CT scan or an MRI of your head. These tests may be done if your symptoms are severe or unusual. TREATMENT This condition may be treated with lifestyle changes and medicines to help relieve symptoms. HOME CARE INSTRUCTIONS Managing Pain  Take over-the-counter and prescription medicines only as told by your health care provider.  Lie down in a dark, quiet room when you have a headache.  If directed, apply ice to the head and neck area: ? Put ice in a plastic bag. ? Place a towel between your skin and the bag. ? Leave the ice on for 20 minutes, 2-3 times per day.  Use a heating pad or a hot shower to apply heat to the head and neck area as told by your health care provider. Eating and Drinking  Eat meals on a regular schedule.  Limit alcohol use.  Decrease your caffeine intake, or stop using caffeine. General Instructions  Keep all follow-up visits as told by your health care provider. This is important.  Keep a headache journal to help find out what may trigger your headaches. For example, write down: ? What you eat and drink. ? How much sleep you get. ? Any change to your  diet or medicines.  Try massage or other relaxation techniques.  Limit stress.  Sit up straight, and avoid tensing your muscles.  Do not use tobacco products, including cigarettes, chewing tobacco, or e-cigarettes. If you need help quitting, ask your health care provider.  Exercise regularly as told by your health care provider.  Get 7-9 hours of sleep, or the amount recommended by your health care provider. SEEK MEDICAL CARE IF:  Your symptoms are not helped by medicine.  You have a headache that is different from what you  normally experience.  You have nausea or you vomit.  You have a fever. SEEK IMMEDIATE MEDICAL CARE IF:  Your headache becomes severe.  You have repeated vomiting.  You have a stiff neck.  You have a loss of vision.  You have problems with speech.  You have pain in your eye or ear.  You have muscular weakness or loss of muscle control.  You lose your balance or you have trouble walking.  You feel faint or you pass out.  You have confusion. This information is not intended to replace advice given to you by your health care provider. Make sure you discuss any questions you have with your health care provider. Document Released: 08/26/2005 Document Revised: 05/17/2015 Document Reviewed: 12/19/2014 Elsevier Interactive Patient Education  2017 [employer].

## 2018-06-30 NOTE — Progress Notes (Signed)
Chief Complaint  Patient presents with  . Follow-up     was seen at [facility] care for headche and back pain they never told her what was the reason for these symptoms or lab results    HPI Headache and Back Pain Patient reports that she went to [facility] for back pain in September 2019 She reports that she was gets some headaches from the back of the neck that is on the left and radiates to the forehead The headache is intermittent and the pain is 6/10 She states that if she gets some sinus headaches as well She takes nothing for the headaches She does not see any association with sleep The headache is not aggravated by light Seems to be aggravated by flexion and extension of the head     Past Medical History:  Diagnosis Date  . Chicken pox   . Chronic kidney disease    right renal stone  . Colon polyps   . Depression    no medication  . Frequent headaches   . GERD (gastroesophageal reflux disease)   . Hypothyroidism    possible low thyroid. No meds  . Thyroid disease     No current outpatient medications on file.   No current facility-administered medications for this visit.     Allergies: No Known Allergies  Past Surgical History:  Procedure Laterality Date  . DILATION AND CURETTAGE OF UTERUS    . HAMMER TOE SURGERY    . TUBAL LIGATION      Social History   Socioeconomic History  . Marital status: Divorced    Spouse name: Not on file  . Number of children: Not on file  . Years of education: Not on file  . Highest education level: Not on file  Occupational History  . Occupation: Special needschildren  transportation   Social Needs  . Financial resource strain: Not on file  . Food insecurity:    Worry: Not on file    Inability: Not on file  . Transportation needs:    Medical: Not on file    Non-medical: Not on file  Tobacco Use  . Smoking status: Former Smoker    Packs/day: 1.00    Years: 3.00    Pack years: 3.00    Types: Cigarettes  . Smokeless  tobacco: Never Used  Substance and Sexual Activity  . Alcohol use: No  . Drug use: No  . Sexual activity: Yes  Lifestyle  . Physical activity:    Days per week: Not on file    Minutes per session: Not on file  . Stress: Not on file  Relationships  . Social connections:    Talks on phone: Not on file    Gets together: Not on file    Attends religious service: Not on file    Active member of club or organization: Not on file    Attends meetings of clubs or organizations: Not on file    Relationship status: Not on file  Other Topics Concern  . Not on file  Social History Narrative  . Not on file    Family History  Problem Relation Age of Onset  . Diabetes Father      ROS Review of Systems See HPI Constitution: No fevers or chills No malaise No diaphoresis Skin: No rash or itching Eyes: no blurry vision, no double vision GU: no dysuria or hematuria Neuro: no dizziness or headaches  all others reviewed and negative   Objective: Vitals:   06/30/18  1115  BP: (!) 147/84  Pulse: 82  Resp: 16  Temp: 97.8 F (36.6 C)  TempSrc: Oral  SpO2: 98%  Weight: 170 lb 12.8 oz (77.5 kg)  Height: 5\' 4"  (1.626 m)    Physical Exam  Constitutional: She is oriented to person, place, and time. She appears well-developed and well-nourished.  HENT:  Head: Normocephalic and atraumatic.  Eyes: Conjunctivae and EOM are normal.  Left eye pupil constricts to light, right eye unresponsive  Cardiovascular: Normal rate, regular rhythm and normal heart sounds.  No murmur heard. Pulmonary/Chest: Effort normal and breath sounds normal. No stridor. No respiratory distress. She has no wheezes.  Neurological: She is alert and oriented to person, place, and time.  Skin: Skin is warm. Capillary refill takes less than 2 seconds.  Psychiatric: She has a normal mood and affect. Her behavior is normal. Judgment and thought content normal.    Assessment and Plan Sharmane was seen today for  follow-up.  Diagnoses and all orders for this visit:  Tension headache  Chronic bilateral low back pain without sciatica  Vision loss of right eye   Discussed tension headache Discussed migraine medication Pt advised to keep headache journal   Jet Armbrust A Creta Levin

## 2018-10-14 ENCOUNTER — Encounter: Payer: BC Managed Care – PPO | Admitting: Family Medicine

## 2018-11-21 ENCOUNTER — Ambulatory Visit: Payer: BC Managed Care – PPO | Admitting: Family Medicine

## 2018-11-21 ENCOUNTER — Other Ambulatory Visit: Payer: Self-pay

## 2018-11-21 ENCOUNTER — Encounter: Payer: Self-pay | Admitting: Family Medicine

## 2018-11-21 VITALS — BP 132/80 | HR 69 | Temp 97.7°F | Ht 64.0 in | Wt 169.0 lb

## 2018-11-21 DIAGNOSIS — R1011 Right upper quadrant pain: Secondary | ICD-10-CM

## 2018-11-21 DIAGNOSIS — R1013 Epigastric pain: Secondary | ICD-10-CM

## 2018-11-21 MED ORDER — OMEPRAZOLE 20 MG PO CPDR: 20.0000 mg | DELAYED_RELEASE_CAPSULE | Freq: Two times a day (BID) | ORAL | 1 refills | Status: DC

## 2018-11-21 NOTE — Patient Instructions (Signed)
° ° ° °  If you have lab work done today you will be contacted with your lab results within the next 2 weeks.  If you have not heard from us then please contact us. The fastest way to get your results is to register for My Chart. ° ° °IF you received an x-ray today, you will receive an invoice from [facility]. Please contact [facility] at [phone] with questions or concerns regarding your invoice.  ° °IF you received labwork today, you will receive an invoice from [facility]. Please contact [facility] at [phone] with questions or concerns regarding your invoice.  ° °Our billing staff will not be able to assist you with questions regarding bills from these companies. ° °You will be contacted with the lab results as soon as they are available. The fastest way to get your results is to activate your My Chart account. Instructions are located on the last page of this paperwork. If you have not heard from us regarding the results in 2 weeks, please contact this office. °  ° ° ° °

## 2018-11-21 NOTE — Progress Notes (Signed)
3/14/202012:34 PM  Rachael Powell 08-Jan-1951, 68 y.o. female [id number]  Chief Complaint  Patient presents with  . Pain    having heartburn symptoms during and ater she eats since wednesday. Not eating spicy foods, just fruits, salads and veggie    HPI:   Patient is a 68 y.o. female who presents today for heartburn  Has been having burning sensation in epigastric region after eating, nausea, reflux, no vomiting Has been present for several months Comes and goes Taking milk helps, has not done any meds Denies any changes in her stools Denies any black tarry stools Sometimes pain radiates to RUQ Eats healthy, not spicy Does not smoke   Fall Risk  11/21/2018 06/30/2018 12/03/2017 02/01/2016  Falls in the past year? 0 No No Yes  Number falls in past yr: 0 - - 1  Injury with Fall? 0 - - Yes     Depression screen [facility] 2/9 11/21/2018 06/30/2018 12/03/2017  Decreased Interest 0 0 0  Down, Depressed, Hopeless 0 0 1  PHQ - 2 Score 0 0 1    No Known Allergies  Prior to Admission medications   Not on File    Past Medical History:  Diagnosis Date  . Chicken pox   . Chronic kidney disease    right renal stone  . Colon polyps   . Depression    no medication  . Frequent headaches   . GERD (gastroesophageal reflux disease)   . Hypothyroidism    possible low thyroid. No meds  . Thyroid disease     Past Surgical History:  Procedure Laterality Date  . DILATION AND CURETTAGE OF UTERUS    . HAMMER TOE SURGERY    . TUBAL LIGATION      Social History   Tobacco Use  . Smoking status: Former Smoker    Packs/day: 1.00    Years: 3.00    Pack years: 3.00    Types: Cigarettes  . Smokeless tobacco: Never Used  Substance Use Topics  . Alcohol use: No    Family History  Problem Relation Age of Onset  . Diabetes Father     ROS Per hpi  OBJECTIVE:  Blood pressure 132/80, pulse 69, temperature 97.7 F (36.5 C), height 5\' 4"  (1.626 m), weight 169 lb (76.7 kg),  SpO2 97 %. Body mass index is 29.01 kg/m.   Physical Exam Vitals signs and nursing note reviewed.  Constitutional:      Appearance: She is well-developed.  HENT:     Head: Normocephalic and atraumatic.     Mouth/Throat:     Pharynx: No oropharyngeal exudate.  Eyes:     General: No scleral icterus.    Conjunctiva/sclera: Conjunctivae normal.     Pupils: Pupils are equal, round, and reactive to light.  Neck:     Musculoskeletal: Neck supple.  Cardiovascular:     Rate and Rhythm: Normal rate and regular rhythm.     Heart sounds: Normal heart sounds. No murmur. No friction rub. No gallop.   Pulmonary:     Effort: Pulmonary effort is normal.     Breath sounds: Normal breath sounds. No wheezing or rales.  Abdominal:     General: Bowel sounds are normal. There is no distension.     Palpations: Abdomen is soft. There is no hepatomegaly, splenomegaly or mass.     Tenderness: There is abdominal tenderness in the right upper quadrant and epigastric area. There is no guarding or rebound. Negative signs include Murphy's sign.  Hernia: No hernia is present.  Skin:    General: Skin is warm and dry.  Neurological:     Mental Status: She is alert and oriented to person, place, and time.    ASSESSMENT and PLAN  1. Postprandial RUQ pain - Comprehensive metabolic panel - CBC  2. Epigastric pain - H. pylori breath test  Will start omeprazole, discussed dietary modifications and RTC precautions.   Other orders - omeprazole (PRILOSEC) 20 MG capsule; Take 1 capsule (20 mg total) by mouth 2 (two) times daily before a meal.  Return if symptoms worsen or fail to improve.    Myles Lipps, MD Primary Care at [facility] [address] [place], [place] [address] Ph.  [phone] Fax [phone]

## 2018-11-22 LAB — COMPREHENSIVE METABOLIC PANEL
ALT: 37 IU/L — ABNORMAL HIGH (ref 0–32)
AST: 32 IU/L (ref 0–40)
Albumin/Globulin Ratio: 1.5 (ref 1.2–2.2)
Albumin: 4.6 g/dL (ref 3.8–4.8)
Alkaline Phosphatase: 73 IU/L (ref 39–117)
BUN/Creatinine Ratio: 7 — ABNORMAL LOW (ref 12–28)
BUN: 7 mg/dL — ABNORMAL LOW (ref 8–27)
Bilirubin Total: 0.5 mg/dL (ref 0.0–1.2)
CO2: 21 mmol/L (ref 20–29)
Calcium: 9.6 mg/dL (ref 8.7–10.3)
Chloride: 104 mmol/L (ref 96–106)
Creatinine, Ser: 1.02 mg/dL — ABNORMAL HIGH (ref 0.57–1.00)
GFR calc Af Amer: 65 mL/min/{1.73_m2} (ref >59)
GFR calc non Af Amer: 56 mL/min/{1.73_m2} — ABNORMAL LOW (ref >59)
Globulin, Total: 3.1 g/dL (ref 1.5–4.5)
Glucose: 90 mg/dL (ref 65–99)
Potassium: 4.4 mmol/L (ref 3.5–5.2)
Sodium: 141 mmol/L (ref 134–144)
Total Protein: 7.7 g/dL (ref 6.0–8.5)

## 2018-11-22 LAB — CBC
Hematocrit: 41.2 % (ref 34.0–46.6)
Hemoglobin: 13.8 g/dL (ref 11.1–15.9)
MCH: 28 pg (ref 26.6–33.0)
MCHC: 33.5 g/dL (ref 31.5–35.7)
MCV: 84 fL (ref 79–97)
Platelets: 296 10*3/uL (ref 150–450)
RBC: 4.92 x10E6/uL (ref 3.77–5.28)
RDW: 12.8 % (ref 11.7–15.4)
WBC: 4.7 10*3/uL (ref 3.4–10.8)

## 2018-11-24 LAB — H. PYLORI BREATH TEST: H pylori Breath Test: NEGATIVE

## 2019-01-13 ENCOUNTER — Telehealth: Payer: BC Managed Care – PPO | Admitting: Family Medicine

## 2019-01-13 ENCOUNTER — Other Ambulatory Visit: Payer: Self-pay

## 2019-01-13 ENCOUNTER — Encounter: Payer: Self-pay | Admitting: Family Medicine

## 2019-01-13 VITALS — BP 124/76 | HR 69 | Temp 98.1°F | Resp 14 | Wt 168.2 lb

## 2019-01-13 DIAGNOSIS — M545 Low back pain, unspecified: Secondary | ICD-10-CM

## 2019-01-13 DIAGNOSIS — Z87442 Personal history of urinary calculi: Secondary | ICD-10-CM | POA: Diagnosis not present

## 2019-01-13 LAB — POCT URINALYSIS DIP (MANUAL ENTRY)
Bilirubin, UA: NEGATIVE
Blood, UA: NEGATIVE
Glucose, UA: NEGATIVE mg/dL
Ketones, POC UA: NEGATIVE mg/dL
Leukocytes, UA: NEGATIVE
Nitrite, UA: NEGATIVE
Protein Ur, POC: NEGATIVE mg/dL
Spec Grav, UA: 1.015 (ref 1.010–1.025)
Urobilinogen, UA: 0.2 E.U./dL
pH, UA: 5 (ref 5.0–8.0)

## 2019-01-13 LAB — POC MICROSCOPIC URINALYSIS (UMFC): Mucus: ABSENT

## 2019-01-13 MED ORDER — MELOXICAM 7.5 MG PO TABS: 7.5000 mg | ORAL_TABLET | Freq: Every day | ORAL | 0 refills | Status: DC | PRN

## 2019-01-13 MED ORDER — TIZANIDINE HCL 2 MG PO TABS: 2.0000 mg | ORAL_TABLET | Freq: Four times a day (QID) | ORAL | 0 refills | Status: DC | PRN

## 2019-01-13 NOTE — Progress Notes (Signed)
CC- left flank pain and left lower abd pain painful with moment been going on for 3 weeks. Hx of kidney infection and kidney stone but do not think this is the same pain. No issus with urine burning or frequency.

## 2019-01-13 NOTE — Patient Instructions (Addendum)
Pain does appear to be coming from the back over the muscles of the back.  Sometimes that can cause pinched nerve for spasm that can radiate around to the side.  Try meloxicam once per day, do not combine with other NSAIDs like Advil or Aleve.  Okay to continue heat and gentle range of motion.  See other information on back pain below.  If needed I did also write for a muscle relaxant that can be taken at bedtime or up to every 6 hours (only if needed as that can cause sedation and risk of falling).  Follow-up if symptoms or not improving in the next 10 to 14 days, sooner if worse.   Acute Back Pain, Adult Acute back pain is sudden and usually short-lived. It is often caused by an injury to the muscles and tissues in the back. The injury may result from:  A muscle or ligament getting overstretched or torn (strained). Ligaments are tissues that connect bones to each other. Lifting something improperly can cause a back strain.  Wear and tear (degeneration) of the spinal disks. Spinal disks are circular tissue that provides cushioning between the bones of the spine (vertebrae).  Twisting motions, such as while playing sports or doing yard work.  A hit to the back.  Arthritis. You may have a physical exam, lab tests, and imaging tests to find the cause of your pain. Acute back pain usually goes away with rest and home care. Follow these instructions at home: Managing pain, stiffness, and swelling  Take over-the-counter and prescription medicines only as told by your health care provider.  Your health care provider may recommend applying ice during the first 24-48 hours after your pain starts. To do this: ? Put ice in a plastic bag. ? Place a towel between your skin and the bag. ? Leave the ice on for 20 minutes, 2-3 times a day.  If directed, apply heat to the affected area as often as told by your health care provider. Use the heat source that your health care provider recommends, such as a  moist heat pack or a heating pad. ? Place a towel between your skin and the heat source. ? Leave the heat on for 20-30 minutes. ? Remove the heat if your skin turns bright red. This is especially important if you are unable to feel pain, heat, or cold. You have a greater risk of getting burned. Activity   Do not stay in bed. Staying in bed for more than 1-2 days can delay your recovery.  Sit up and stand up straight. Avoid leaning forward when you sit, or hunching over when you stand. ? If you work at a desk, sit close to it so you do not need to lean over. Keep your chin tucked in. Keep your neck drawn back, and keep your elbows bent at a right angle. Your arms should look like the letter "L." ? Sit high and close to the steering wheel when you drive. Add lower back (lumbar) support to your car seat, if needed.  Take short walks on even surfaces as soon as you are able. Try to increase the length of time you walk each day.  Do not sit, drive, or stand in one place for more than 30 minutes at a time. Sitting or standing for long periods of time can put stress on your back.  Do not drive or use heavy machinery while taking prescription pain medicine.  Use proper lifting techniques. When you bend  and lift, use positions that put less stress on your back: ? [place] your knees. ? Keep the load close to your body. ? Avoid twisting.  Exercise regularly as told by your health care provider. Exercising helps your back heal faster and helps prevent back injuries by keeping muscles strong and flexible.  Work with a physical therapist to make a safe exercise program, as recommended by your health care provider. Do any exercises as told by your physical therapist. Lifestyle  Maintain a healthy weight. Extra weight puts stress on your back and makes it difficult to have good posture.  Avoid activities or situations that make you feel anxious or stressed. Stress and anxiety increase muscle tension and  can make back pain worse. Learn ways to manage anxiety and stress, such as through exercise. General instructions  Sleep on a firm mattress in a comfortable position. Try lying on your side with your knees slightly bent. If you lie on your back, put a pillow under your knees.  Follow your treatment plan as told by your health care provider. This may include: ? Cognitive or behavioral therapy. ? Acupuncture or massage therapy. ? Meditation or yoga. Contact a health care provider if:  You have pain that is not relieved with rest or medicine.  You have increasing pain going down into your legs or buttocks.  Your pain does not improve after 2 weeks.  You have pain at night.  You lose weight without trying.  You have a fever or chills. Get help right away if:  You develop new bowel or bladder control problems.  You have unusual weakness or numbness in your arms or legs.  You develop nausea or vomiting.  You develop abdominal pain.  You feel faint. Summary  Acute back pain is sudden and usually short-lived.  Use proper lifting techniques. When you bend and lift, use positions that put less stress on your back.  Take over-the-counter and prescription medicines and apply heat or ice as directed by your health care provider. This information is not intended to replace advice given to you by your health care provider. Make sure you discuss any questions you have with your health care provider. Document Released: 08/26/2005 Document Revised: 04/02/2018 Document Reviewed: 04/09/2017 Elsevier Interactive Patient Education  [employer]2019 [employer]       If you have lab work done today you will be contacted with your lab results within the next 2 weeks.  If you have not heard from us then please contact us. The fastest way to get your results is to register for My Chart.   IF you received an x-ray today, you will receive an invoice from [facility]. Please contact [facility] at [phone] with questions or concerns regarding your invoice.   IF you received labwork today, you will receive an invoice from [facility]. Please contact [facility] at [phone] with questions or concerns regarding your invoice.   Our billing staff will not be able to assist you with questions regarding bills from these companies.  You will be contacted with the lab results as soon as they are available. The fastest way to get your results is to activate your My Chart account. Instructions are located on the last page of this paperwork. If you have not heard from us regarding the results in 2 weeks, please contact this office.

## 2019-01-13 NOTE — Progress Notes (Signed)
Virtual Visit via Video Note  I connected with Rachael ErikssonRochelle Diane Eplin on 01/13/19 at 10:13 AM by a video enabled telemedicine application [employer] and verified that I am speaking with the correct person using two identifiers.   I discussed the limitations, risks, security and privacy concerns of performing an evaluation and management service by telephone and the availability of in person appointments. I also discussed with the patient that there may be a patient responsible charge related to this service. The patient expressed understanding and agreed to proceed, consent obtained  Ultimately reverted to in office visit.  Pt see at 3:24 PM   Chief complaint:  Left flank and lower abd pain.   History of Present Illness: Rachael Powell is a 68 y.o. female  Left lower back pain, past 3 weeks. NKI.  Had been doing some squats, leg lifts, planks - last month. Similar in past, but had just started back exercising in prior month. Pain in back - feels like weight on back, moves up back some to mid back only. sore with movement. No rash. 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 Minimal lifting - lunches only.  Pain now for past week radiating around to left side, to LLQ of abdomen in area of ovaries. No leg radiation. No n/v. Diarrhea once few days ago - normal since.  Colonoscopy without diverticulosis in 2015.  No bowel or bladder incontinence, no saddle anesthesia, no lower extremity weakness or radiating pain.  Hx of nephrolithiasis. Few years ago. Had some kidney issues at that time. Current pain is different (tugging pain, not sharp like in past).  Prior stones passed on own.   Some back issues in past that has flared in past.   No dysuria, no hematuria.  Frequent urination with water intake, but no changes.  No vaginal bleeding, no discharge.   Seen in March for postprandial RUQ pain, epigastric pain. Treated with omeprazole. No similar  sx's currently.   Tx: heating pad. No change with tylenol.    Patient Active Problem List   Diagnosis Date Noted  . 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 Lobar pneumonia (HCC) 10/31/2017   Past Medical History:  Diagnosis Date  . Chicken pox   . Chronic kidney disease    right renal stone  . Colon polyps   . Depression    no medication  . Frequent headaches   . GERD (gastroesophageal reflux disease)   . Hypothyroidism    possible low thyroid. No meds  . Thyroid disease    Past Surgical History:  Procedure Laterality Date  . DILATION AND CURETTAGE OF UTERUS    . HAMMER TOE SURGERY    . TUBAL LIGATION     No Known Allergies Prior to Admission medications   Not on File   Social History   Socioeconomic History  . Marital status: Divorced    Spouse name: Not on file  . Number of children: Not on file  . Years of education: Not on file  . Highest education level: Not on file  Occupational History  . Occupation: Special needschildren  transportation   Social Needs  . Financial resource strain: Not on file  . Food insecurity:    Worry: Not on file    Inability: Not on file  . Transportation needs:    Medical: Not on file    Non-medical: Not on file  Tobacco Use  . Smoking status: Former Smoker  Packs/day: 1.00    Years: 3.00    Pack years: 3.00    Types: Cigarettes  . Smokeless tobacco: Never Used  Substance and Sexual Activity  . Alcohol use: No  . Drug use: No  . Sexual activity: Yes  Lifestyle  . Physical activity:    Days per week: Not on file    Minutes per session: Not on file  . Stress: Not on file  Relationships  . Social connections:    Talks on phone: Not on file    Gets together: Not on file    Attends religious service: Not on file    Active member of club or organization: Not on file    Attends meetings of clubs or organizations: Not on file    Relationship status: Not on file  . Intimate partner violence:    Fear of current or ex  partner: Not on file    Emotionally abused: Not on file    Physically abused: Not on file    Forced sexual activity: Not on file  Other Topics Concern  . Not on file  Social History Narrative  . Not on file    Observations/Objective:  No distress, appropriate response on phone.     In office: Vitals:   01/13/19 1437  BP: 124/76  Pulse: 69  Resp: 14  Temp: 98.1 F (36.7 C)  TempSrc: Oral  SpO2: 99%  Weight: 168 lb 3.2 oz (76.3 kg)     General :alert, no distress Respiratory :normal respiratory effort Cardiovascular :normal heart rate Abdomen: Soft, nondistended, nontender, no reproduction of pain with palpation, no CVA tenderness Musculoskeletal: Left hip pain-free range of motion with no reproduction of pain with internal/external rotation. Lumbar spine: Somewhat decreased right lateral flexion due to pain on the left.  Slow but full flexion, reproduction of symptoms with some range of motion.  Tender palpation at the left paraspinal muscles, no midline bony tenderness.  Negative seated straight leg raise. Neuro: 2+ patellar, Achilles reflexes,  skin intact, no rash    Results for orders placed or performed in visit on 01/13/19  POCT urinalysis dipstick  Result Value Ref Range   Color, UA yellow yellow   Clarity, UA clear clear   Glucose, UA negative negative mg/dL   Bilirubin, UA negative negative   Ketones, POC UA negative negative mg/dL   Spec Grav, UA 1.610 9.604 - 1.025   Blood, UA negative negative   pH, UA 5.0 5.0 - 8.0   Protein Ur, POC negative negative mg/dL   Urobilinogen, UA 0.2 0.2 or 1.0 E.U./dL   Nitrite, UA Negative Negative   Leukocytes, UA Negative Negative  POCT Microscopic Urinalysis ([facility])  Result Value Ref Range   WBC,UR,HPF,POC None None WBC/hpf   RBC,UR,HPF,POC None None RBC/hpf   Bacteria None None, Too numerous to count   Mucus Absent Absent   Epithelial Cells, UR Per Microscopy None None, Too numerous to count cells/hpf     Assessment and Plan: History of nephrolithiasis - Plan: POCT urinalysis dipstick, POCT Microscopic Urinalysis ([facility]), Basic metabolic panel  Acute left-sided low back pain without sciatica - Plan: POCT urinalysis dipstick, POCT Microscopic Urinalysis ([facility]), Basic metabolic panel  Suspected overuse/musculoskeletal strain with new exercises started month prior.  No red flags on exam or history.  Unlikely nephrolith given normal urinalysis.  Will check BMP given prior elevated creatinine with previous nephrolith although less likely.  Also can check creatinine with use of Mobic.  -Heat, handout given on acute  back pain.  -Trial of Mobic, potential side effects, risk discussed.  Short-term use as needed.  -Tizanidine if needed for spasm, but again side effects and risks were discussed.  Lowest effective dose.  -Recheck next 10 to 14 days if not improving, sooner if worse   Follow Up Instructions:  Patient Instructions   Pain does appear to be coming from the back over the muscles of the back.  Sometimes that can cause pinched nerve for spasm that can radiate around to the side.  Try meloxicam once per day, do not combine with other NSAIDs like Advil or Aleve.  Okay to continue heat and gentle range of motion.  See other information on back pain below.  If needed I did also write for a muscle relaxant that can be taken at bedtime or up to every 6 hours (only if needed as that can cause sedation and risk of falling).  Follow-up if symptoms or not improving in the next 10 to 14 days, sooner if worse.   Acute Back Pain, Adult Acute back pain is sudden and usually short-lived. It is often caused by an injury to the muscles and tissues in the back. The injury may result from:  A muscle or ligament getting overstretched or torn (strained). Ligaments are tissues that connect bones to each other. Lifting something improperly can cause a back strain.  Wear and tear (degeneration) of the spinal disks.  Spinal disks are circular tissue that provides cushioning between the bones of the spine (vertebrae).  Twisting motions, such as while playing sports or doing yard work.  A hit to the back.  Arthritis. You may have a physical exam, lab tests, and imaging tests to find the cause of your pain. Acute back pain usually goes away with rest and home care. Follow these instructions at home: Managing pain, stiffness, and swelling  Take over-the-counter and prescription medicines only as told by your health care provider.  Your health care provider may recommend applying ice during the first 24-48 hours after your pain starts. To do this: ? Put ice in a plastic bag. ? Place a towel between your skin and the bag. ? Leave the ice on for 20 minutes, 2-3 times a day.  If directed, apply heat to the affected area as often as told by your health care provider. Use the heat source that your health care provider recommends, such as a moist heat pack or a heating pad. ? Place a towel between your skin and the heat source. ? Leave the heat on for 20-30 minutes. ? Remove the heat if your skin turns bright red. This is especially important if you are unable to feel pain, heat, or cold. You have a greater risk of getting burned. Activity   Do not stay in bed. Staying in bed for more than 1-2 days can delay your recovery.  Sit up and stand up straight. Avoid leaning forward when you sit, or hunching over when you stand. ? If you work at a desk, sit close to it so you do not need to lean over. Keep your chin tucked in. Keep your neck drawn back, and keep your elbows bent at a right angle. Your arms should look like the letter "L." ? Sit high and close to the steering wheel when you drive. Add lower back (lumbar) support to your car seat, if needed.  Take short walks on even surfaces as soon as you are able. Try to increase the length of time  you walk each day.  Do not sit, drive, or stand in one place for  more than 30 minutes at a time. Sitting or standing for long periods of time can put stress on your back.  Do not drive or use heavy machinery while taking prescription pain medicine.  Use proper lifting techniques. When you bend and lift, use positions that put less stress on your back: ? [place] your knees. ? Keep the load close to your body. ? Avoid twisting.  Exercise regularly as told by your health care provider. Exercising helps your back heal faster and helps prevent back injuries by keeping muscles strong and flexible.  Work with a physical therapist to make a safe exercise program, as recommended by your health care provider. Do any exercises as told by your physical therapist. Lifestyle  Maintain a healthy weight. Extra weight puts stress on your back and makes it difficult to have good posture.  Avoid activities or situations that make you feel anxious or stressed. Stress and anxiety increase muscle tension and can make back pain worse. Learn ways to manage anxiety and stress, such as through exercise. General instructions  Sleep on a firm mattress in a comfortable position. Try lying on your side with your knees slightly bent. If you lie on your back, put a pillow under your knees.  Follow your treatment plan as told by your health care provider. This may include: ? Cognitive or behavioral therapy. ? Acupuncture or massage therapy. ? Meditation or yoga. Contact a health care provider if:  You have pain that is not relieved with rest or medicine.  You have increasing pain going down into your legs or buttocks.  Your pain does not improve after 2 weeks.  You have pain at night.  You lose weight without trying.  You have a fever or chills. Get help right away if:  You develop new bowel or bladder control problems.  You have unusual weakness or numbness in your arms or legs.  You develop nausea or vomiting.  You develop abdominal pain.  You feel faint. Summary   Acute back pain is sudden and usually short-lived.  Use proper lifting techniques. When you bend and lift, use positions that put less stress on your back.  Take over-the-counter and prescription medicines and apply heat or ice as directed by your health care provider. This information is not intended to replace advice given to you by your health care provider. Make sure you discuss any questions you have with your health care provider. Document Released: 08/26/2005 Document Revised: 04/02/2018 Document Reviewed: 04/09/2017 Elsevier Interactive Patient Education  [employer]       If you have lab work done today you will be contacted with your lab results within the next 2 weeks.  If you have not heard from [place] then please contact us. The fastest way to get your results is to register for My Chart.   IF you received an x-ray today, you will receive an invoice from [facility]. Please contact [facility] at [phone] with questions or concerns regarding your invoice.   IF you received labwork today, you will receive an invoice from [facility]. Please contact [facility] at [phone] with questions or concerns regarding your invoice.   Our billing staff will not be able to assist you with questions regarding bills from these companies.  You will be contacted with the lab results as soon as they are available. The fastest way to get your results is to  activate your My Chart account. Instructions are located on the last page of this paperwork. If you have not heard from [place] regarding the results in 2 weeks, please contact this office.          I discussed the assessment and treatment plan with the patient. The patient was provided an opportunity to ask questions and all were answered. The patient agreed with the plan and demonstrated an understanding of the instructions.   The patient was advised to call back or seek an in-person evaluation if the symptoms  worsen or if the condition fails to improve as anticipated.  I provided 11 minutes of non-face-to-face time during this encounter.   Shade Flood, MD

## 2019-01-14 LAB — BASIC METABOLIC PANEL
BUN/Creatinine Ratio: 20 (ref 12–28)
BUN: 16 mg/dL (ref 8–27)
CO2: 24 mmol/L (ref 20–29)
Calcium: 9.5 mg/dL (ref 8.7–10.3)
Chloride: 103 mmol/L (ref 96–106)
Creatinine, Ser: 0.8 mg/dL (ref 0.57–1.00)
GFR calc Af Amer: 86 mL/min/{1.73_m2} (ref >59)
GFR calc non Af Amer: 75 mL/min/{1.73_m2} (ref >59)
Glucose: 82 mg/dL (ref 65–99)
Potassium: 4 mmol/L (ref 3.5–5.2)
Sodium: 140 mmol/L (ref 134–144)

## 2019-01-15 ENCOUNTER — Encounter: Payer: BC Managed Care – PPO | Admitting: Family Medicine

## 2019-03-11 ENCOUNTER — Telehealth: Payer: Self-pay | Admitting: Family Medicine

## 2019-03-11 NOTE — Telephone Encounter (Signed)
LVM to reschedule appt

## 2019-03-12 ENCOUNTER — Encounter

## 2019-03-12 ENCOUNTER — Encounter: Payer: BC Managed Care – PPO | Admitting: Family Medicine

## 2019-12-13 ENCOUNTER — Ambulatory Visit: Payer: BC Managed Care – PPO

## 2019-12-13 ENCOUNTER — Ambulatory Visit: Payer: BC Managed Care – PPO | Admitting: Family Medicine

## 2019-12-13 ENCOUNTER — Encounter: Payer: Self-pay | Admitting: Family Medicine

## 2019-12-13 ENCOUNTER — Other Ambulatory Visit: Payer: Self-pay | Admitting: Family Medicine

## 2019-12-13 ENCOUNTER — Ambulatory Visit (INDEPENDENT_AMBULATORY_CARE_PROVIDER_SITE_OTHER): Payer: BC Managed Care – PPO

## 2019-12-13 ENCOUNTER — Other Ambulatory Visit: Payer: Self-pay

## 2019-12-13 VITALS — BP 145/88 | HR 82 | Temp 98.1°F | Ht 64.0 in | Wt 171.0 lb

## 2019-12-13 DIAGNOSIS — M545 Low back pain, unspecified: Secondary | ICD-10-CM

## 2019-12-13 DIAGNOSIS — Z1231 Encounter for screening mammogram for malignant neoplasm of breast: Secondary | ICD-10-CM

## 2019-12-13 DIAGNOSIS — Z780 Asymptomatic menopausal state: Secondary | ICD-10-CM

## 2019-12-13 DIAGNOSIS — Z13820 Encounter for screening for osteoporosis: Secondary | ICD-10-CM | POA: Diagnosis not present

## 2019-12-13 MED ORDER — ACETAMINOPHEN ER 650 MG PO TBCR: 650.0000 mg | EXTENDED_RELEASE_TABLET | Freq: Three times a day (TID) | ORAL | Status: AC | PRN
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
## 2019-12-13 NOTE — Patient Instructions (Addendum)
   CLINICAL DATA:  Recent twisting injury with low back pain, initial encounter  EXAM: LUMBAR SPINE - COMPLETE 4+ VIEW  COMPARISON:  10/14/2003  FINDINGS: Five lumbar type vertebral bodies are well visualized. Vertebral body height is well maintained. No pars defects are seen. No anterolisthesis is noted. Osteophytic changes are seen from L2-S1. Disc space narrowing is noted at L4-5 and L5-S1.  IMPRESSION: Progressive degenerative changes when compared with the prior exam.   Electronically Signed   By: Alcide Clever M.D.   On: 12/13/2019 15:48  If you have lab work done today you will be contacted with your lab results within the next 2 weeks.  If you have not heard from [place] then please contact us. The fastest way to get your results is to register for My Chart.   IF you received an x-ray today, you will receive an invoice from [facility]. Please contact [facility] at [phone] with questions or concerns regarding your invoice.   IF you received labwork today, you will receive an invoice from [facility]. Please contact [facility] at [phone] with questions or concerns regarding your invoice.   Our billing staff will not be able to assist you with questions regarding bills from these companies.  You will be contacted with the lab results as soon as they are available. The fastest way to get your results is to activate your My Chart account. Instructions are located on the last page of this paperwork. If you have not heard from [place] regarding the results in 2 weeks, please contact this office.

## 2019-12-14 ENCOUNTER — Ambulatory Visit
Admission: RE | Admit: 2019-12-14 | Discharge: 2019-12-14 | Disposition: A | Discharge status: Home / Self Care | Payer: BC Managed Care – PPO | Source: Ambulatory Visit | Attending: Family Medicine | Admitting: Family Medicine

## 2019-12-14 DIAGNOSIS — Z1231 Encounter for screening mammogram for malignant neoplasm of breast: Secondary | ICD-10-CM

## 2019-12-16 ENCOUNTER — Other Ambulatory Visit: Payer: Self-pay

## 2019-12-16 ENCOUNTER — Ambulatory Visit: Payer: BC Managed Care – PPO | Admitting: Physical Therapy

## 2019-12-16 ENCOUNTER — Encounter: Payer: Self-pay | Admitting: Physical Therapy

## 2019-12-16 ENCOUNTER — Ambulatory Visit: Payer: BC Managed Care – PPO | Attending: Family Medicine | Admitting: Physical Therapy

## 2019-12-16 DIAGNOSIS — M6281 Muscle weakness (generalized): Secondary | ICD-10-CM

## 2019-12-16 DIAGNOSIS — M545 Low back pain, unspecified: Secondary | ICD-10-CM

## 2019-12-16 NOTE — Therapy (Signed)
[facility] [address] [place], [place], [address] Phone: [phone]   Fax:  [phone]  Physical Therapy Evaluation  Patient Details  Name: Rachael Powell MRN: [id number] Date of Birth: 19-Jul-1951 Referring Provider (PT): Collie Siad, MD   Encounter Date: 12/16/2019  PT End of Session - 12/16/19 1331    Visit Number  1    Number of Visits  12    Date for PT Re-Evaluation  01/27/20    Authorization Type  BCBS    PT Start Time  1212    PT Stop Time  1250    PT Time Calculation (min)  38 min    Activity Tolerance  Patient limited by pain    Behavior During Therapy  [facility] for tasks assessed/performed       Past Medical History:  Diagnosis Date  . Chicken pox   . Chronic kidney disease    right renal stone  . Colon polyps   . Depression    no medication  . Frequent headaches   . GERD (gastroesophageal reflux disease)   . Hypothyroidism    possible low thyroid. No meds  . Thyroid disease     Past Surgical History:  Procedure Laterality Date  . DILATION AND CURETTAGE OF UTERUS    . HAMMER TOE SURGERY    . TUBAL LIGATION      There were no vitals filed for this visit.   Subjective Assessment - 12/16/19 1217    Subjective  Pt. is a 69 y/o female referred to PT for recent/acute onset of LBP beginning late March-no mechanism of injury noted. Pain starts in low back and "wraps around" to bilateral hips and also with intermittent "burning" pain distally down left leg-leg pain since not as bad (initially was distal to foot) but still with intermittent left leg pain symptoms. Pain is worst "getting up" and better with sitting.    Limitations  Standing;Walking;Lifting    Patient Stated Goals  Resolve back pain    Currently in Pain?  Yes    Pain Score  --   7-8/10   Pain Location  Back    Pain Orientation  Right;Left;Lower    Pain Descriptors / Indicators  Burning;Aching    Pain Type  Acute pain    Pain Onset  1  to 4 weeks ago    Pain Frequency  Intermittent    Aggravating Factors   getting up, bending    Pain Relieving Factors  sitting, rest    Effect of Pain on Daily Activities  limits activity tolerance         [facility] PT Assessment - 12/16/19 0001      Assessment   Medical Diagnosis  Acute bilateral LBP    Referring Provider (PT)  Collie Siad, MD    Onset Date/Surgical Date  12/06/19    Prior Therapy  none      Precautions   Precautions  None      Restrictions   Weight Bearing Restrictions  No      Balance Screen   Has the patient fallen in the past 6 months  No      Prior Function   Level of Independence  Independent with basic ADLs      Cognition   Overall Cognitive Status  Within Functional Limits for tasks assessed      Observation/Other Assessments   Focus on Therapeutic Outcomes (FOTO)   --   unavaible at time of eval  Sensation   Additional Comments  hypersensitivity to light touch RLE vs. left with L2-S2 dermatomal screen      ROM / Strength   AROM / PROM / Strength  AROM;PROM;Strength      AROM   Overall AROM Comments  hip PROM limited past 90 deg due to LBP but otherwise bilat. hip AROM/PROM grossly [facility]    AROM Assessment Site  Lumbar    Lumbar Flexion  40   limited due to pain-increased lumbar and LLE pain   Lumbar Extension  30   mild local lumbar pain but feels better than flexion   Lumbar - Right Side Bend  40    Lumbar - Left Side Bend  40    Lumbar - Right Rotation  WFL    Lumbar - Left Rotation  [facility]      Strength   Strength Assessment Site  Hip;Knee;Ankle    Right/Left Hip  Right;Left    Right Hip Flexion  5/5    Right Hip Extension  4/5    Right Hip External Rotation   5/5    Right Hip Internal Rotation  5/5    Right Hip ABduction  4/5    Right Hip ADduction  4/5    Left Hip Flexion  5/5    Left Hip Extension  4/5    Left Hip External Rotation  5/5    Left Hip Internal Rotation  5/5    Left Hip ABduction  4/5    Left Hip ADduction   4/5    Right/Left Knee  Right;Left    Right Knee Flexion  5/5    Right Knee Extension  5/5    Left Knee Flexion  5/5    Left Knee Extension  4+/5    Right/Left Ankle  Right;Left    Right Ankle Dorsiflexion  5/5    Right Ankle Inversion  5/5    Right Ankle Eversion  5/5    Left Ankle Dorsiflexion  4/5    Left Ankle Inversion  4/5    Left Ankle Eversion  4/5      Flexibility   Soft Tissue Assessment /Muscle Length  --   SLR 40 deg left, 60 deg right, piriformis tightness bilat.     Palpation   SI assessment   no innominate rotation noted    Palpation comment  diffuse lumbar muscle tightness with associated tenderness to palpation      Special Tests   Other special tests  SLR (+) on left at 40 deg for increased lumbar and left LE symptoms, increased local LBP only with right SLR                Objective measurements completed on examination: See above findings.      [facility] Adult PT Treatment/Exercise - 12/16/19 0001      Exercises   Exercises  --   HEP handout review-see chart copy            PT Education - 12/16/19 1331    Education Details  HEP, potential symptom etiology, POC    Person(s) Educated  Patient    Methods  Explanation;Demonstration;Verbal cues;Handout    Comprehension  Verbalized understanding          PT Long Term Goals - 12/16/19 1341      PT LONG TERM GOAL #1   Title  Independent with HEP    Baseline  needs HEP    Time  6    Period  Weeks    Status  New    Target Date  01/27/20      PT LONG TERM GOAL #2   Title  Centralize left LE pain to lumbar region for improved activity tolerance without leg pain for work duties    Time  6    Period  Weeks    Status  New    Target Date  01/27/20      PT LONG TERM GOAL #3   Title  Increase trunk flexion AROM 20 deg or greater to improve ability for bending for chores and activities such as donning shoes    Baseline  40 deg    Time  6    Period  Weeks    Status  New    Target Date   01/27/20      PT LONG TERM GOAL #4   Title  Tolerate transfers and standing/ambulation for IADLs and work duties with LBP 3/10 or less    Baseline  7-8/10    Time  6    Period  Weeks    Status  New    Target Date  01/27/20      PT LONG TERM GOAL #5   Title  Increase left quad, ankle and bilat. hip strength grossly 1/2 MMT grade to improve ability for lifting activities    Baseline  see objective    Time  6    Period  Weeks    Status  New    Target Date  01/27/20             Plan - 12/16/19 1332    Clinical Impression Statement  Pt. presents with acute onset LBP with left LE radiating symptoms for which suspect radicular etiology. Based on acute onset of symptoms, extension bias for trunk ROM in addition to (+) SLR would suspect possible discogenic etiology though ease of symptoms with sitting would be atypical presentation for this. Plan trial extension bias ROM to see if this can help ease pain and centralize left LE radiating symptoms. Pt. also presents with contributing lumbar/QL region myofascial pain and for pain which radiates from back around to hips suspect possible association with potential referral pattern from thoracolumbar region. Pt. would benefit from PT to help relieve pain and improve functional activity tolerance. 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 is tentatively planning return to work on Monday 4/12 and reports feels able to resume without restrictions.    Personal Factors and Comorbidities  Age    Examination-Activity Limitations  Transfers;Squat;Lift;Sleep;Locomotion Level;Stand    Examination-Participation Restrictions  --   work duties as bus Environmental health practitioner  Evolving/Moderate complexity    Clinical Decision Making  Moderate    Rehab Potential  Good    PT Frequency  2x / week    PT Duration  6 weeks    PT Treatment/Interventions  ADLs/Self Care Home Management;Traction;Cryotherapy;Electrical  Stimulation;Ultrasound;Moist Heat;Therapeutic activities;Therapeutic exercise;Neuromuscular re-education;Manual techniques;Dry needling;Taping    PT Next Visit Plan  check response extension bias ROM and progress extension exercises as tolerated, STM to lumbar region-check thoracolumbar PAs as well for referral to hips, LAD left hip, stretch piriformis, potential left sided sciatic nerve floss/glide    PT Home Exercise Plan  prone press up on elbows vs. extension in standing if no place for prone positioning when away from home, pelvic tilt, LTR, hip bridge    Consulted and Agree with Plan of Care  Patient  Patient will benefit from skilled therapeutic intervention in order to improve the following deficits and impairments:  Pain, Impaired flexibility, Decreased strength, Decreased activity tolerance, Decreased range of motion, Impaired sensation, Difficulty walking, Increased muscle spasms  Visit Diagnosis: Acute bilateral low back pain without sciatica  Muscle weakness (generalized)     Problem List Patient Active Problem List   Diagnosis Date Noted  . 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 Lobar pneumonia (HCC) 10/31/2017    Lazarus Gowda, PT, DPT 12/16/19 1:47 PM  [facility] [address] [place], [place], [address] Phone: [phone]   Fax:  [phone]  Name: Luceal Hollibaugh MRN: [id number] Date of Birth: 1950-09-30

## 2019-12-17 ENCOUNTER — Telehealth: Payer: Self-pay | Admitting: Family Medicine

## 2019-12-17 NOTE — Telephone Encounter (Signed)
Pt called and is needing her letter for her to go back to work with no restriction for 4/12. Pt had her therapy appt on 4/8 and would like a call when she can come pick them up. Please advise. [phone]

## 2020-01-11 ENCOUNTER — Encounter: Payer: Self-pay | Admitting: Adult Health Nurse Practitioner

## 2020-01-11 ENCOUNTER — Other Ambulatory Visit: Payer: Self-pay

## 2020-01-11 ENCOUNTER — Ambulatory Visit: Payer: BC Managed Care – PPO | Admitting: Adult Health Nurse Practitioner

## 2020-01-11 VITALS — BP 136/83 | HR 78 | Temp 98.0°F | Ht 64.0 in | Wt 178.4 lb

## 2020-01-11 DIAGNOSIS — M545 Low back pain, unspecified: Secondary | ICD-10-CM

## 2020-01-11 DIAGNOSIS — R519 Headache, unspecified: Secondary | ICD-10-CM | POA: Diagnosis not present

## 2020-01-11 LAB — POCT URINALYSIS DIP (MANUAL ENTRY)
Bilirubin, UA: NEGATIVE
Blood, UA: NEGATIVE
Glucose, UA: NEGATIVE mg/dL
Ketones, POC UA: NEGATIVE mg/dL
Leukocytes, UA: NEGATIVE
Nitrite, UA: NEGATIVE
Protein Ur, POC: NEGATIVE mg/dL
Spec Grav, UA: >=1.03 — AB (ref 1.010–1.025)
Urobilinogen, UA: 0.2 E.U./dL
pH, UA: 5.5 (ref 5.0–8.0)

## 2020-01-11 MED ORDER — METHYLPREDNISOLONE 4 MG PO TBPK: ORAL_TABLET | ORAL | 0 refills | Status: DC

## 2020-01-11 MED ORDER — METHOCARBAMOL 500 MG PO TABS: 500.0000 mg | ORAL_TABLET | Freq: Four times a day (QID) | ORAL | 0 refills | Status: DC

## 2020-01-11 NOTE — Patient Instructions (Addendum)
Lumbosacral Radiculopathy Lumbosacral radiculopathy is a condition that involves the spinal nerves and nerve roots in the low back and bottom of the spine. The condition develops when these nerves and nerve roots move out of place or become inflamed and cause symptoms. What are the causes? This condition may be caused by:  Pressure from a disk that bulges out of place (herniated disk). A disk is a plate of soft cartilage that separates bones in the spine.  Disk changes that occur with age (disk degeneration).  A narrowing of the bones of the lower back (spinal stenosis).  A tumor.  An infection.  An injury that places sudden pressure on the disks that cushion the bones of your lower spine. What increases the risk? You are more likely to develop this condition if:  You are a female who is 30-50 years old.  You are a female who is 50-60 years old.  You use improper technique when lifting things.  You are overweight or live a sedentary lifestyle.  Your work requires frequent lifting.  You smoke.  You do repetitive activities that strain the spine. What are the signs or symptoms? Symptoms of this condition include:  Pain that goes down from your back into your legs (sciatica), usually on one side of the body. This is the most common symptom. The pain may be worse with sitting, coughing, or sneezing.  Pain and numbness in your legs.  Muscle weakness.  Tingling.  Loss of bladder control or bowel control. How is this diagnosed? This condition may be diagnosed based on:  Your symptoms and medical history.  A physical exam. If the pain is lasting, you may have tests, such as:  MRI scan.  X-ray.  CT scan.  A type of X-ray used to examine the spinal canal after injecting a dye into your spine (myelogram).  A test to measure how electrical impulses move through a nerve (nerve conduction study). How is this treated? Treatment may depend on the cause of the condition and  may include:  Working with a physical therapist.  Taking pain medicine.  Applying heat and ice to affected areas.  Doing stretches to improve flexibility.  Doing exercises to strengthen back muscles.  Having chiropractic spinal manipulation.  Using transcutaneous electrical nerve stimulation (TENS) therapy.  Getting a steroid injection in the spine. In some cases, no treatment is needed. If the condition is long-lasting (chronic), or if symptoms are severe, treatment may involve surgery or lifestyle changes, such as following a weight-loss plan. Follow these instructions at home: Activity  Avoid bending and other activities that make the problem worse.  Maintain a proper position when standing or sitting: ? When standing, keep your upper back and neck straight, with your shoulders pulled back. Avoid slouching. ? When sitting, keep your back straight and relax your shoulders. Do not round your shoulders or pull them backward.  Do not sit or stand in one place for long periods of time.  Take brief periods of rest throughout the day. This will reduce your pain. It is usually better to rest by lying down or standing, not sitting.  When you are resting for longer periods, mix in some mild activity or stretching between periods of rest. This will help to prevent stiffness and pain.  Get regular exercise. Ask your health care provider what activities are safe for you. If you were shown how to do any exercises or stretches, do them as directed by your health care provider.  Do   not lift anything that is heavier than 10 lb (4.5 kg) or the limit that you are told by your health care provider. Always use proper lifting technique, which includes: ? Bending your knees. ? Keeping the load close to your body. ? Avoiding twisting. Managing pain  If directed, put ice on the affected area: ? Put ice in a plastic bag. ? Place a towel between your skin and the bag. ? Leave the ice on for 20  minutes, 2-3 times a day.  If directed, apply heat to the affected area as often as told by your health care provider. Use the heat source that your health care provider recommends, such as a moist heat pack or a heating pad. ? Place a towel between your skin and the heat source. ? Leave the heat on for 20-30 minutes. ? Remove the heat if your skin turns bright red. This is especially important if you are unable to feel pain, heat, or cold. You may have a greater risk of getting burned.  Take over-the-counter and prescription medicines only as told by your health care provider. General instructions  Sleep on a firm mattress in a comfortable position. Try lying on your side with your knees slightly bent. If you lie on your back, put a pillow under your knees.  Do not drive or use heavy machinery while taking prescription pain medicine.  If your health care provider prescribed a diet or exercise program, follow it as directed.  Keep all follow-up visits as told by your health care provider. This is important. Contact a health care provider if:  Your pain does not improve over time, even when taking pain medicines. Get help right away if:  You develop severe pain.  Your pain suddenly gets worse.  You develop increasing weakness in your legs.  You lose the ability to control your bladder or bowel.  You have difficulty walking or balancing.  You have a fever. Summary  Lumbosacral radiculopathy is a condition that occurs when the spinal nerves and nerve roots in the lower part of the spine move out of place or become inflamed and cause symptoms.  Symptoms include pain, numbness, and tingling that go down from your back into your legs (sciatica), muscle weakness, and loss of bladder control or bowel control.  If directed, apply ice or heat to the affected area as told by your health care provider.  Follow instructions about activity, rest, and proper lifting technique. This  information is not intended to replace advice given to you by your health care provider. Make sure you discuss any questions you have with your health care provider. Document Revised: 08/14/2017 Document Reviewed: 08/14/2017 Elsevier Patient Education  [employer].     If you have lab work done today you will be contacted with your lab results within the next 2 weeks.  If you have not heard from [place] then please contact us. The fastest way to get your results is to register for My Chart.   IF you received an x-ray today, you will receive an invoice from [facility]. Please contact [facility] at [phone] with questions or concerns regarding your invoice.   IF you received labwork today, you will receive an invoice from [place]. Please contact [facility] at [phone] with questions or concerns regarding your invoice.   Our billing staff will not be able to assist you with questions regarding bills from these companies.  You will be contacted with the lab results as soon as  they are available. The fastest way to get your results is to activate your My Chart account. Instructions are located on the last page of this paperwork. If you have not heard from [place] regarding the results in 2 weeks, please contact this office.     Sciatica Rehab Ask your health care provider which exercises are safe for you. Do exercises exactly as told by your health care provider and adjust them as directed. It is normal to feel mild stretching, pulling, tightness, or discomfort as you do these exercises. Stop right away if you feel sudden pain or your pain gets worse. Do not begin these exercises until told by your health care provider. Stretching and range-of-motion exercises These exercises warm up your muscles and joints and improve the movement and flexibility of your hips and back. These exercises also help to relieve pain, numbness, and tingling. Sciatic nerve glide 1. Sit in a chair  with your head facing down toward your chest. Place your hands behind your back. Let your shoulders slump forward. 2. Slowly straighten one of your legs while you tilt your head back as if you are looking toward the ceiling. Only straighten your leg as far as you can without making your symptoms worse. 3. Hold this position for __________ seconds. 4. Slowly return to the starting position. 5. Repeat with your other leg. Repeat __________ times. Complete this exercise __________ times a day. Knee to chest with hip adduction and internal rotation  1. Lie on your back on a firm surface with both legs straight. 2. Bend one of your knees and move it up toward your chest until you feel a gentle stretch in your lower back and buttock. Then, move your knee toward the shoulder that is on the opposite side from your leg. This is hip adduction and internal rotation. ? Hold your leg in this position by holding on to the front of your knee. 3. Hold this position for __________ seconds. 4. Slowly return to the starting position. 5. Repeat with your other leg. Repeat __________ times. Complete this exercise __________ times a day. Prone extension on elbows  1. Lie on your abdomen on a firm surface. A bed may be too soft for this exercise. 2. Prop yourself up on your elbows. 3. Use your arms to help lift your chest up until you feel a gentle stretch in your abdomen and your lower back. ? This will place some of your body weight on your elbows. If this is uncomfortable, try stacking pillows under your chest. ? Your hips should stay down, against the surface that you are lying on. Keep your hip and back muscles relaxed. 4. Hold this position for __________ seconds. 5. Slowly relax your upper body and return to the starting position. Repeat __________ times. Complete this exercise __________ times a day. Strengthening exercises These exercises build strength and endurance in your back. Endurance is the  ability to use your muscles for a long time, even after they get tired. Pelvic tilt This exercise strengthens the muscles that lie deep in the abdomen. 1. Lie on your back on a firm surface. Bend your knees and keep your feet flat on the floor. 2. Tense your abdominal muscles. Tip your pelvis up toward the ceiling and flatten your lower back into the floor. ? To help with this exercise, you may place a small towel under your lower back and try to push your back into the towel. 3. Hold this position for __________ seconds. 4.  Let your muscles relax completely before you repeat this exercise. Repeat __________ times. Complete this exercise __________ times a day. Alternating arm and leg raises  1. Get on your hands and knees on a firm surface. If you are on a hard floor, you may want to use padding, such as an exercise mat, to cushion your knees. 2. Line up your arms and legs. Your hands should be directly below your shoulders, and your knees should be directly below your hips. 3. Lift your left leg behind you. At the same time, raise your right arm and straighten it in front of you. ? Do not lift your leg higher than your hip. ? Do not lift your arm higher than your shoulder. ? Keep your abdominal and back muscles tight. ? Keep your hips facing the ground. ? Do not arch your back. ? Keep your balance carefully, and do not hold your breath. 4. Hold this position for __________ seconds. 5. Slowly return to the starting position. 6. Repeat with your right leg and your left arm. Repeat __________ times. Complete this exercise __________ times a day. Posture and body mechanics Good posture and healthy body mechanics can help to relieve stress in your body's tissues and joints. Body mechanics refers to the movements and positions of your body while you do your daily activities. Posture is part of body mechanics. Good posture means:  Your spine is in its natural S-curve position (neutral).  Your  shoulders are pulled back slightly.  Your head is not tipped forward. Follow these guidelines to improve your posture and body mechanics in your everyday activities. Standing   When standing, keep your spine neutral and your feet about hip width apart. Keep a slight bend in your knees. Your ears, shoulders, and hips should line up.  When you do a task in which you stand in one place for a long time, place one foot up on a stable object that is 2-4 inches (5-10 cm) high, such as a footstool. This helps keep your spine neutral. Sitting   When sitting, keep your spine neutral and keep your feet flat on the floor. Use a footrest, if necessary, and keep your thighs parallel to the floor. Avoid rounding your shoulders, and avoid tilting your head forward.  When working at a desk or a computer, keep your desk at a height where your hands are slightly lower than your elbows. Slide your chair under your desk so you are close enough to maintain good posture.  When working at a computer, place your monitor at a height where you are looking straight ahead and you do not have to tilt your head forward or downward to look at the screen. Resting  When lying down and resting, avoid positions that are most painful for you.  If you have pain with activities such as sitting, bending, stooping, or squatting, lie in a position in which your body does not bend very much. For example, avoid curling up on your side with your arms and knees near your chest (fetal position).  If you have pain with activities such as standing for a long time or reaching with your arms, lie with your spine in a neutral position and bend your knees slightly. Try the following positions: ? Lying on your side with a pillow between your knees. ? Lying on your back with a pillow under your knees. Lifting   When lifting objects, keep your feet at least shoulder width apart and tighten your abdominal  muscles.  Bend your knees and hips  and keep your spine neutral. It is important to lift using the strength of your legs, not your back. Do not lock your knees straight out.  Always ask for help to lift heavy or awkward objects. This information is not intended to replace advice given to you by your health care provider. Make sure you discuss any questions you have with your health care provider. Document Revised: 12/18/2018 Document Reviewed: 09/17/2018 Elsevier Patient Education  2020 [employer].  Low Back Sprain or Strain Rehab Ask your health care provider which exercises are safe for you. Do exercises exactly as told by your health care provider and adjust them as directed. It is normal to feel mild stretching, pulling, tightness, or discomfort as you do these exercises. Stop right away if you feel sudden pain or your pain gets worse. Do not begin these exercises until told by your health care provider. Stretching and range-of-motion exercises These exercises warm up your muscles and joints and improve the movement and flexibility of your back. These exercises also help to relieve pain, numbness, and tingling. Lumbar rotation  6. Lie on your back on a firm surface and bend your knees. 7. Straighten your arms out to your sides so each arm forms a 90-degree angle (right angle) with a side of your body. 8. Slowly move (rotate) both of your knees to one side of your body until you feel a stretch in your lower back (lumbar). Try not to let your shoulders lift off the floor. 9. Hold this position for __________ seconds. 10. Tense your abdominal muscles and slowly move your knees back to the starting position. 11. Repeat this exercise on the other side of your body. Repeat __________ times. Complete this exercise __________ times a day. Single knee to chest  6. Lie on your back on a firm surface with both legs straight. 7. Bend one of your knees. Use your hands to move your knee up toward your chest until you feel a gentle  stretch in your lower back and buttock. ? Hold your leg in this position by holding on to the front of your knee. ? Keep your other leg as straight as possible. 8. Hold this position for __________ seconds. 9. Slowly return to the starting position. 10. Repeat with your other leg. Repeat __________ times. Complete this exercise __________ times a day. Prone extension on elbows  6. Lie on your abdomen on a firm surface (prone position). 7. Prop yourself up on your elbows. 8. Use your arms to help lift your chest up until you feel a gentle stretch in your abdomen and your lower back. ? This will place some of your body weight on your elbows. If this is uncomfortable, try stacking pillows under your chest. ? Your hips should stay down, against the surface that you are lying on. Keep your hip and back muscles relaxed. 9. Hold this position for __________ seconds. 10. Slowly relax your upper body and return to the starting position. Repeat __________ times. Complete this exercise __________ times a day. Strengthening exercises These exercises build strength and endurance in your back. Endurance is the ability to use your muscles for a long time, even after they get tired. Pelvic tilt This exercise strengthens the muscles that lie deep in the abdomen. 5. Lie on your back on a firm surface. Bend your knees and keep your feet flat on the floor. 6. Tense your abdominal muscles. Tip your pelvis up toward  the ceiling and flatten your lower back into the floor. ? To help with this exercise, you may place a small towel under your lower back and try to push your back into the towel. 7. Hold this position for __________ seconds. 8. Let your muscles relax completely before you repeat this exercise. Repeat __________ times. Complete this exercise __________ times a day. Alternating arm and leg raises  7. Get on your hands and knees on a firm surface. If you are on a hard floor, you may want to use  padding, such as an exercise mat, to cushion your knees. 8. Line up your arms and legs. Your hands should be directly below your shoulders, and your knees should be directly below your hips. 9. Lift your left leg behind you. At the same time, raise your right arm and straighten it in front of you. ? Do not lift your leg higher than your hip. ? Do not lift your arm higher than your shoulder. ? Keep your abdominal and back muscles tight. ? Keep your hips facing the ground. ? Do not arch your back. ? Keep your balance carefully, and do not hold your breath. 10. Hold this position for __________ seconds. 11. Slowly return to the starting position. 12. Repeat with your right leg and your left arm. Repeat __________ times. Complete this exercise __________ times a day. Abdominal set with straight leg raise  1. Lie on your back on a firm surface. 2. Bend one of your knees and keep your other leg straight. 3. Tense your abdominal muscles and lift your straight leg up, 4-6 inches (10-15 cm) off the ground. 4. Keep your abdominal muscles tight and hold this position for __________ seconds. ? Do not hold your breath. ? Do not arch your back. Keep it flat against the ground. 5. Keep your abdominal muscles tense as you slowly lower your leg back to the starting position. 6. Repeat with your other leg. Repeat __________ times. Complete this exercise __________ times a day. Single leg lower with bent knees 1. Lie on your back on a firm surface. 2. Tense your abdominal muscles and lift your feet off the floor, one foot at a time, so your knees and hips are bent in 90-degree angles (right angles). ? Your knees should be over your hips and your lower legs should be parallel to the floor. 3. Keeping your abdominal muscles tense and your knee bent, slowly lower one of your legs so your toe touches the ground. 4. Lift your leg back up to return to the starting position. ? Do not hold your breath. ? Do not  let your back arch. Keep your back flat against the ground. 5. Repeat with your other leg. Repeat __________ times. Complete this exercise __________ times a day. Posture and body mechanics Good posture and healthy body mechanics can help to relieve stress in your body's tissues and joints. Body mechanics refers to the movements and positions of your body while you do your daily activities. Posture is part of body mechanics. Good posture means:  Your spine is in its natural S-curve position (neutral).  Your shoulders are pulled back slightly.  Your head is not tipped forward. Follow these guidelines to improve your posture and body mechanics in your everyday activities. Standing   When standing, keep your spine neutral and your feet about hip width apart. Keep a slight bend in your knees. Your ears, shoulders, and hips should line up.  When you do a task  in which you stand in one place for a long time, place one foot up on a stable object that is 2-4 inches (5-10 cm) high, such as a footstool. This helps keep your spine neutral. Sitting   When sitting, keep your spine neutral and keep your feet flat on the floor. Use a footrest, if necessary, and keep your thighs parallel to the floor. Avoid rounding your shoulders, and avoid tilting your head forward.  When working at a desk or a computer, keep your desk at a height where your hands are slightly lower than your elbows. Slide your chair under your desk so you are close enough to maintain good posture.  When working at a computer, place your monitor at a height where you are looking straight ahead and you do not have to tilt your head forward or downward to look at the screen. Resting  When lying down and resting, avoid positions that are most painful for you.  If you have pain with activities such as sitting, bending, stooping, or squatting, lie in a position in which your body does not bend very much. For example, avoid curling up on  your side with your arms and knees near your chest (fetal position).  If you have pain with activities such as standing for a long time or reaching with your arms, lie with your spine in a neutral position and bend your knees slightly. Try the following positions: ? Lying on your side with a pillow between your knees. ? Lying on your back with a pillow under your knees. Lifting   When lifting objects, keep your feet at least shoulder width apart and tighten your abdominal muscles.  Bend your knees and hips and keep your spine neutral. It is important to lift using the strength of your legs, not your back. Do not lock your knees straight out.  Always ask for help to lift heavy or awkward objects. This information is not intended to replace advice given to you by your health care provider. Make sure you discuss any questions you have with your health care provider. Document Revised: 12/18/2018 Document Reviewed: 09/17/2018 Elsevier Patient Education  2020 [employer].

## 2020-01-11 NOTE — Progress Notes (Signed)
   01/11/2020  Rachael Powell 03/29/1951 [id number]        SUBJECTIVE:  Rachael Powell is a 69 y.o. female who complains of low back pain for 2 month(s), positional with bending or lifting, with radiation down the legs. Precipitating factors: none recalled by the patient. Prior history of back problems: recurrent self limited episodes of low back pain in the past. There is numbness in the legs.  OBJECTIVE: BP 136/83 (BP Location: Right Arm, Patient Position: Sitting, Cuff Size: Normal)   Pulse 78   Temp 98 F (36.7 C) (Temporal)   Ht 5\' 4"  (1.626 m)   Wt 178 lb 6.4 oz (80.9 kg)   SpO2 94%   BMI 30.62 kg/m   Patient appears to be in mild to moderate pain, antalgic gait noted. Lumbosacral spine area reveals no local tenderness or mass.  Painful and reduced LS ROM noted. Straight leg raise is positive at 20 degrees on right. DTR's, motor strength and sensation normal, including heel and toe gait.  Peripheral pulses are palpable. X-Ray: personally reviewed by myself with the patient:  Head; normocephalic, atraumatic. PERLA. ENT- ENT exam normal, no neck nodes or sinus tenderness.   She was seen by Dr. who sent her to PT.  She was only going to be able to go oncedue to it being out of network and mostly unaffordable.  Declines another referral.   In addition, complains of sinus headache/pressure underneath eyes x 2 months.  Patient has not tried any OTC relief.  Mild congestion.  No fever, sweats, weight loss or body aches.  NO ear pain or cough.   Lumbar Xrays show reactive endplate changes at L5-S1 with osteophytic with early disc height loss and some degen collapse at L5-S1.  NO dynamic instability noted.  ASSESSMENT:   1. Low back pain, unspecified back pain laterality, unspecified chronicity, unspecified whether sciatica present   2. Sinus headache     Lumbar Radiculitis at S1 on the right   PLAN: For acute pain, rest, intermittent application of heat (do  not sleep on heating pad), analgesics and muscle relaxants are recommended. Discussed longer term treatment plan of prn NSAID's and discussed a home back care exercise program with flexion exercise routine. Proper lifting with avoidance of heavy lifting discussed. Consider Physical Therapy and XRay studies if not improving. Call or return to clinic prn if these symptoms worsen or fail to improve as anticipated.  Meds ordered this encounter  Medications  . methylPREDNISolone (MEDROL DOSEPAK) 4 MG TBPK tablet    Sig: As directed on pkg label    Dispense:  21 each    Refill:  0  . methocarbamol (ROBAXIN) 500 MG tablet    Sig: Take 1 tablet (500 mg total) by mouth 4 (four) times daily.    Dispense:  30 tablet    Refill:  0    Recommended nasal sprays, neti pots, OTC istamine and decongestant.   Creta Levin, NP

## 2020-01-24 NOTE — Therapy (Signed)
[place], [place], [address] Phone: [phone]   Fax:  [phone]  Physical Therapy Evaluation/Discharge  Patient Details  Name: Rachael Powell MRN: [id number] Date of Birth: 04-Aug-1951 Referring Provider (PT): Delia Chimes, MD   Encounter Date: 12/16/2019    Past Medical History:  Diagnosis Date  . Chicken pox   . Chronic kidney disease    right renal stone  . Colon polyps   . Depression    no medication  . Frequent headaches   . GERD (gastroesophageal reflux disease)   . Hypothyroidism    possible low thyroid. No meds  . Thyroid disease     Past Surgical History:  Procedure Laterality Date  . DILATION AND CURETTAGE OF UTERUS    . HAMMER TOE SURGERY    . TUBAL LIGATION      There were no vitals filed for this visit.                    Objective measurements completed on examination: See above findings.                   PT Long Term Goals - 12/16/19 1341      PT LONG TERM GOAL #1   Title  Independent with HEP    Baseline  needs HEP    Time  6    Period  Weeks    Status  New    Target Date  01/27/20      PT LONG TERM GOAL #2   Title  Centralize left LE pain to lumbar region for improved activity tolerance without leg pain for work duties    Time  6    Period  Weeks    Status  New    Target Date  01/27/20      PT LONG TERM GOAL #3   Title  Increase trunk flexion AROM 20 deg or greater to improve ability for bending for chores and activities such as donning shoes    Baseline  40 deg    Time  6    Period  Weeks    Status  New    Target Date  01/27/20      PT LONG TERM GOAL #4   Title  Tolerate transfers and standing/ambulation for IADLs and work duties with LBP 3/10 or less    Baseline  7-8/10    Time  6    Period  Weeks    Status  New    Target Date  01/27/20      PT LONG TERM GOAL #5   Title  Increase left quad, ankle and bilat.  hip strength grossly 1/2 MMT grade to improve ability for lifting activities    Baseline  see objective    Time  6    Period  Weeks    Status  New    Target Date  01/27/20               Patient will benefit from skilled therapeutic intervention in order to improve the following deficits and impairments:  Pain, Impaired flexibility, Decreased strength, Decreased activity tolerance, Decreased range of motion, Impaired sensation, Difficulty walking, Increased muscle spasms  Visit Diagnosis: Acute bilateral low back pain without sciatica - Plan: PT plan of care cert/re-cert  Muscle weakness (generalized) - Plan: PT plan of care cert/re-cert     Problem List Patient Active Problem List   Diagnosis Date Noted  .  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 Lobar pneumonia ([place]) 10/31/2017        PHYSICAL THERAPY DISCHARGE SUMMARY  Visits from Start of Care: 1  Current functional level related to goals / functional outcomes: Patient did not return for further therapy after initial evaluation visit 12/16/19.   Remaining deficits: Current status unknown   Education / Equipment: HEP at eval Plan:                                                    Patient goals were not met. Patient is being discharged due to not returning since the last visit.  ?????          Beaulah Dinning, PT, DPT 01/24/20 1:28 PM     [facility] [address] [place], [place], [address] Phone: [phone]   Fax:  [phone]  Name: Rachael Powell MRN: [id number] Date of Birth: Dec 28, 1950

## 2020-11-01 ENCOUNTER — Ambulatory Visit (INDEPENDENT_AMBULATORY_CARE_PROVIDER_SITE_OTHER): Payer: BC Managed Care – PPO | Admitting: Otolaryngology

## 2020-11-02 ENCOUNTER — Other Ambulatory Visit: Payer: Self-pay

## 2020-11-02 ENCOUNTER — Observation Stay (HOSPITAL_COMMUNITY): Payer: Medicare HMO

## 2020-11-02 ENCOUNTER — Encounter (HOSPITAL_COMMUNITY): Payer: Self-pay | Admitting: Internal Medicine

## 2020-11-02 ENCOUNTER — Ambulatory Visit
Admission: EM | Admit: 2020-11-02 | Discharge: 2020-11-02 | Disposition: A | Discharge status: Home / Self Care | Payer: Medicare HMO | Attending: Emergency Medicine | Admitting: Emergency Medicine

## 2020-11-02 ENCOUNTER — Emergency Department (HOSPITAL_COMMUNITY): Payer: Medicare HMO

## 2020-11-02 ENCOUNTER — Ambulatory Visit (INDEPENDENT_AMBULATORY_CARE_PROVIDER_SITE_OTHER): Payer: Medicare HMO

## 2020-11-02 ENCOUNTER — Inpatient Hospital Stay (HOSPITAL_COMMUNITY)
Admission: EM | Admit: 2020-11-02 | Discharge: 2020-11-06 | DRG: 177 | Disposition: A | Discharge status: Home / Self Care | Payer: Medicare HMO | Attending: Internal Medicine | Admitting: Internal Medicine

## 2020-11-02 DIAGNOSIS — E876 Hypokalemia: Secondary | ICD-10-CM | POA: Diagnosis present

## 2020-11-02 DIAGNOSIS — Z87442 Personal history of urinary calculi: Secondary | ICD-10-CM

## 2020-11-02 DIAGNOSIS — Z87891 Personal history of nicotine dependence: Secondary | ICD-10-CM

## 2020-11-02 DIAGNOSIS — U071 COVID-19: Secondary | ICD-10-CM | POA: Diagnosis not present

## 2020-11-02 DIAGNOSIS — Z8719 Personal history of other diseases of the digestive system: Secondary | ICD-10-CM

## 2020-11-02 DIAGNOSIS — E039 Hypothyroidism, unspecified: Secondary | ICD-10-CM | POA: Diagnosis not present

## 2020-11-02 DIAGNOSIS — E872 Acidosis, unspecified: Secondary | ICD-10-CM | POA: Diagnosis present

## 2020-11-02 DIAGNOSIS — R059 Cough, unspecified: Secondary | ICD-10-CM

## 2020-11-02 DIAGNOSIS — J9601 Acute respiratory failure with hypoxia: Secondary | ICD-10-CM | POA: Diagnosis present

## 2020-11-02 DIAGNOSIS — Z833 Family history of diabetes mellitus: Secondary | ICD-10-CM

## 2020-11-02 DIAGNOSIS — F32A Depression, unspecified: Secondary | ICD-10-CM | POA: Diagnosis present

## 2020-11-02 DIAGNOSIS — E860 Dehydration: Secondary | ICD-10-CM | POA: Diagnosis not present

## 2020-11-02 DIAGNOSIS — K219 Gastro-esophageal reflux disease without esophagitis: Secondary | ICD-10-CM | POA: Diagnosis not present

## 2020-11-02 DIAGNOSIS — Z79899 Other long term (current) drug therapy: Secondary | ICD-10-CM

## 2020-11-02 DIAGNOSIS — E871 Hypo-osmolality and hyponatremia: Secondary | ICD-10-CM | POA: Diagnosis present

## 2020-11-02 DIAGNOSIS — R5383 Other fatigue: Secondary | ICD-10-CM

## 2020-11-02 DIAGNOSIS — R0602 Shortness of breath: Secondary | ICD-10-CM

## 2020-11-02 DIAGNOSIS — J1282 Pneumonia due to coronavirus disease 2019: Secondary | ICD-10-CM | POA: Diagnosis present

## 2020-11-02 DIAGNOSIS — R509 Fever, unspecified: Secondary | ICD-10-CM | POA: Diagnosis not present

## 2020-11-02 LAB — COMPREHENSIVE METABOLIC PANEL
ALT: 43 U/L (ref 0–44)
AST: 41 U/L (ref 15–41)
Albumin: 3.6 g/dL (ref 3.5–5.0)
Alkaline Phosphatase: 52 U/L (ref 38–126)
Anion gap: 14 (ref 5–15)
BUN: 16 mg/dL (ref 8–23)
CO2: 23 mmol/L (ref 22–32)
Calcium: 9.1 mg/dL (ref 8.9–10.3)
Chloride: 97 mmol/L — ABNORMAL LOW (ref 98–111)
Creatinine, Ser: 1.09 mg/dL — ABNORMAL HIGH (ref 0.44–1.00)
GFR, Estimated: 54 mL/min — ABNORMAL LOW (ref >60)
Glucose, Bld: 113 mg/dL — ABNORMAL HIGH (ref 70–99)
Potassium: 3.6 mmol/L (ref 3.5–5.1)
Sodium: 134 mmol/L — ABNORMAL LOW (ref 135–145)
Total Bilirubin: 0.8 mg/dL (ref 0.3–1.2)
Total Protein: 8 g/dL (ref 6.5–8.1)

## 2020-11-02 LAB — RESP PANEL BY RT-PCR (FLU A&B, COVID) ARPGX2
Influenza A by PCR: NEGATIVE
Influenza B by PCR: NEGATIVE
SARS Coronavirus 2 by RT PCR: POSITIVE — AB

## 2020-11-02 LAB — CBC WITH DIFFERENTIAL/PLATELET
Abs Immature Granulocytes: 0.1 10*3/uL — ABNORMAL HIGH (ref 0.00–0.07)
Basophils Absolute: 0 10*3/uL (ref 0.0–0.1)
Basophils Relative: 0 %
Eosinophils Absolute: 0 10*3/uL (ref 0.0–0.5)
Eosinophils Relative: 0 %
HCT: 43 % (ref 36.0–46.0)
Hemoglobin: 13.6 g/dL (ref 12.0–15.0)
Lymphocytes Relative: 24 %
Lymphs Abs: 1.4 10*3/uL (ref 0.7–4.0)
MCH: 27.5 pg (ref 26.0–34.0)
MCHC: 31.6 g/dL (ref 30.0–36.0)
MCV: 87 fL (ref 80.0–100.0)
Monocytes Absolute: 0.3 10*3/uL (ref 0.1–1.0)
Monocytes Relative: 6 %
Myelocytes: 1 %
Neutro Abs: 4 10*3/uL (ref 1.7–7.7)
Neutrophils Relative %: 69 %
Platelets: 280 10*3/uL (ref 150–400)
RBC: 4.94 MIL/uL (ref 3.87–5.11)
RDW: 12.9 % (ref 11.5–15.5)
WBC: 5.8 10*3/uL (ref 4.0–10.5)
nRBC: 0 % (ref 0.0–0.2)
nRBC: 0 /100 WBC

## 2020-11-02 LAB — TSH: TSH: 3.85 u[IU]/mL (ref 0.350–4.500)

## 2020-11-02 LAB — LACTIC ACID, PLASMA
Lactic Acid, Venous: 2.3 mmol/L (ref 0.5–1.9)
Lactic Acid, Venous: 1.2 mmol/L (ref 0.5–1.9)

## 2020-11-02 LAB — C-REACTIVE PROTEIN: CRP: 11.9 mg/dL — ABNORMAL HIGH (ref <1.0)

## 2020-11-02 LAB — LACTATE DEHYDROGENASE: LDH: 319 U/L — ABNORMAL HIGH (ref 98–192)

## 2020-11-02 LAB — PROCALCITONIN: Procalcitonin: <0.1 ng/mL

## 2020-11-02 LAB — D-DIMER, QUANTITATIVE: D-Dimer, Quant: 3.61 ug/mL-FEU — ABNORMAL HIGH (ref 0.00–0.50)

## 2020-11-02 LAB — FERRITIN: Ferritin: 382 ng/mL — ABNORMAL HIGH (ref 11–307)

## 2020-11-02 LAB — FIBRINOGEN: Fibrinogen: 690 mg/dL — ABNORMAL HIGH (ref 210–475)

## 2020-11-02 LAB — TRIGLYCERIDES: Triglycerides: 99 mg/dL (ref <150)

## 2020-11-02 MED ORDER — DEXAMETHASONE SODIUM PHOSPHATE 10 MG/ML IJ SOLN
6.0000 mg | Freq: Once | INTRAMUSCULAR | Status: AC
  Administered 2020-11-02: 6 mg via INTRAVENOUS
  Filled 2020-11-02: qty 1

## 2020-11-02 MED ORDER — SODIUM CHLORIDE 0.9 % IV SOLN
100.0000 mg | Freq: Every day | INTRAVENOUS | Status: AC
  Administered 2020-11-03 – 2020-11-06 (×4): 100 mg via INTRAVENOUS
  Filled 2020-11-02 (×5): qty 20

## 2020-11-02 MED ORDER — LACTATED RINGERS IV SOLN: INTRAVENOUS | Status: AC

## 2020-11-02 MED ORDER — ONDANSETRON HCL 4 MG PO TABS: 4.0000 mg | ORAL_TABLET | Freq: Four times a day (QID) | ORAL | Status: DC | PRN

## 2020-11-02 MED ORDER — METHYLPREDNISOLONE SODIUM SUCC 40 MG IJ SOLR
0.5000 mg/kg | Freq: Two times a day (BID) | INTRAMUSCULAR | Status: DC
  Administered 2020-11-03 – 2020-11-04 (×3): 35.6 mg via INTRAVENOUS
  Filled 2020-11-02 (×3): qty 1

## 2020-11-02 MED ORDER — ALBUTEROL SULFATE HFA 108 (90 BASE) MCG/ACT IN AERS
2.0000 | INHALATION_SPRAY | RESPIRATORY_TRACT | Status: DC | PRN
  Filled 2020-11-02: qty 6.7

## 2020-11-02 MED ORDER — ACETAMINOPHEN 325 MG PO TABS
650.0000 mg | ORAL_TABLET | Freq: Once | ORAL | Status: AC
  Administered 2020-11-02: 650 mg via ORAL
  Filled 2020-11-02: qty 2

## 2020-11-02 MED ORDER — ACETAMINOPHEN 325 MG PO TABS
650.0000 mg | ORAL_TABLET | Freq: Four times a day (QID) | ORAL | Status: DC | PRN
  Administered 2020-11-03: 650 mg via ORAL
  Filled 2020-11-02: qty 2

## 2020-11-02 MED ORDER — LACTATED RINGERS IV BOLUS
250.0000 mL | Freq: Once | INTRAVENOUS | Status: AC
  Administered 2020-11-02: 250 mL via INTRAVENOUS

## 2020-11-02 MED ORDER — ZINC SULFATE 220 (50 ZN) MG PO CAPS
220.0000 mg | ORAL_CAPSULE | Freq: Every day | ORAL | Status: DC
  Administered 2020-11-03 – 2020-11-06 (×4): 220 mg via ORAL
  Filled 2020-11-02 (×4): qty 1

## 2020-11-02 MED ORDER — IOHEXOL 350 MG/ML SOLN
55.0000 mL | Freq: Once | INTRAVENOUS | Status: AC | PRN
  Administered 2020-11-02: 55 mL via INTRAVENOUS

## 2020-11-02 MED ORDER — ENSURE ENLIVE PO LIQD: 237.0000 mL | Freq: Two times a day (BID) | ORAL | Status: DC

## 2020-11-02 MED ORDER — ENOXAPARIN SODIUM 40 MG/0.4ML ~~LOC~~ SOLN
40.0000 mg | SUBCUTANEOUS | Status: DC
  Administered 2020-11-02: 40 mg via SUBCUTANEOUS
  Filled 2020-11-02 (×2): qty 0.4

## 2020-11-02 MED ORDER — SODIUM CHLORIDE 0.9 % IV SOLN: 250.0000 mL | INTRAVENOUS | Status: DC | PRN

## 2020-11-02 MED ORDER — POLYETHYLENE GLYCOL 3350 17 G PO PACK: 17.0000 g | PACK | Freq: Every day | ORAL | Status: DC | PRN

## 2020-11-02 MED ORDER — SODIUM CHLORIDE 0.9% FLUSH
3.0000 mL | Freq: Two times a day (BID) | INTRAVENOUS | Status: DC
  Administered 2020-11-02 – 2020-11-05 (×6): 3 mL via INTRAVENOUS

## 2020-11-02 MED ORDER — SODIUM CHLORIDE 0.9 % IV SOLN
200.0000 mg | Freq: Once | INTRAVENOUS | Status: AC
  Administered 2020-11-02: 200 mg via INTRAVENOUS
  Filled 2020-11-02: qty 40

## 2020-11-02 MED ORDER — PREDNISONE 5 MG PO TABS: 50.0000 mg | ORAL_TABLET | Freq: Every day | ORAL | Status: DC

## 2020-11-02 MED ORDER — HYDROCOD POLST-CPM POLST ER 10-8 MG/5ML PO SUER: 5.0000 mL | Freq: Two times a day (BID) | ORAL | Status: DC | PRN

## 2020-11-02 MED ORDER — PANTOPRAZOLE SODIUM 40 MG PO TBEC
40.0000 mg | DELAYED_RELEASE_TABLET | Freq: Every day | ORAL | Status: DC
  Administered 2020-11-03 – 2020-11-06 (×5): 40 mg via ORAL
  Filled 2020-11-02 (×5): qty 1

## 2020-11-02 MED ORDER — ASCORBIC ACID 500 MG PO TABS
500.0000 mg | ORAL_TABLET | Freq: Every day | ORAL | Status: DC
  Administered 2020-11-03 – 2020-11-06 (×4): 500 mg via ORAL
  Filled 2020-11-02 (×5): qty 1

## 2020-11-02 MED ORDER — ONDANSETRON HCL 4 MG/2ML IJ SOLN: 4.0000 mg | Freq: Four times a day (QID) | INTRAMUSCULAR | Status: DC | PRN

## 2020-11-02 MED ORDER — SODIUM CHLORIDE 0.9% FLUSH: 3.0000 mL | INTRAVENOUS | Status: DC | PRN

## 2020-11-02 NOTE — ED Notes (Signed)
Updated pt daughter on pt condition and treatment plan

## 2020-11-02 NOTE — ED Notes (Signed)
Patient is being discharged from the Urgent Care and sent to the Emergency Department via POV . Per [facility]  patient is in need of higher level of care due to abnormal chest x-ray, low 02 status. Patient is aware and verbalizes understanding of plan of care.  Vitals:   11/02/20 1108 11/02/20 1110  BP:    Pulse:    Resp:    Temp:    SpO2: (!) 88% 92%

## 2020-11-02 NOTE — ED Notes (Signed)
Pt does not want to wear any oxygen however O2 Sats > 90% on room air

## 2020-11-02 NOTE — ED Triage Notes (Signed)
Pt sent over by UC for possible covid and low sats of 88 % , sats 91 % here in the ED , nad noted

## 2020-11-02 NOTE — ED Notes (Signed)
3 L [place] applied to Pt.

## 2020-11-02 NOTE — ED Notes (Signed)
Patient transported to CT 

## 2020-11-02 NOTE — H&P (Addendum)
History and Physical    Rachael Powell HRC:[id number] DOB: [birth year] DOA: 11/02/2020  PCP: [name], MD  Patient coming from: Home   Chief Complaint: Weakness   HPI:    With past medical history of gastroesophageal reflux disease, hypothyroidism (not currently on therapy) who presents to [facility] emergency department with complaints of malaise and weakness.  Patient explains that approximately 10 days ago she began to experience severe generalized body aches.  Shortly thereafter she began to experience dry nonproductive cough, generalized weakness and poor appetite.  Patient also complains of intermittent fevers over the span of time.  Patient denies any significant shortness of breath although does note that she gets extremely fatigued with minimal exertion.  As the days of symptoms progressed, patient also began to experience intermittent watery diarrhea and nausea with occasional vomiting.  Patient denies sick contacts.  Of note, patient has never been vaccinated for COVID-19.  Due to patient's progressively worsening generalized weakness,  lack of appetite and minimal oral intake patient mentioned presented to [facility] emergency room for evaluation.  Upon evaluation in the emergency department, patient was found to be Covid positive by PCR.  D-dimer was found to be somewhat elevated at 3.61 and therefore CT angiogram of the chest was performed revealing no evidence of pulmonary embolism but revealing bilateral basilar groundglass infiltrates consistent with COVID-19 pneumonia.  Patient was found to exhibit oxygen saturations of 89 to 90% and required approximately 3 L of oxygen via nasal cannula to achieve 94%.  Intravenous dexamethasone and remdesivir were administered by the emergency department staff.  The hospitalist group was then called to assess the patient admission to the hospital.  Review of Systems:   Review of Systems  Constitutional:  Positive for fever and malaise/fatigue.  Respiratory: Positive for cough.   Gastrointestinal: Positive for diarrhea, nausea and vomiting.  Neurological: Positive for weakness.  All other systems reviewed and are negative.   Past Medical History:  Diagnosis Date  . Chicken pox   . Chronic kidney disease    right renal stone  . Colon polyps   . Depression    no medication  . Frequent headaches   . GERD (gastroesophageal reflux disease)   . Hypothyroidism    possible low thyroid. No meds  . Thyroid disease     Past Surgical History:  Procedure Laterality Date  . DILATION AND CURETTAGE OF UTERUS    . HAMMER TOE SURGERY    . TUBAL LIGATION       reports that she has quit smoking. Her smoking use included cigarettes. She has a 3.00 pack-year smoking history. She has never used smokeless tobacco. She reports that she does not drink alcohol and does not use drugs.  No Known Allergies  Family History  Problem Relation Age of Onset  . Diabetes Father      Prior to Admission medications   Medication Sig Start Date End Date Taking? Authorizing Provider  acetaminophen (TYLENOL 8 HOUR ARTHRITIS PAIN) 650 MG CR tablet Take 1 tablet (650 mg total) by mouth every 8 (eight) hours as needed for pain. 12/13/19  Yes [name], MD    Physical Exam: Vitals:   11/02/20 2202 11/02/20 2222 11/02/20 2223 11/02/20 2224  BP: 108/70     Pulse: 87     Resp: (!) 21     Temp: 98.5 F (36.9 C)     TempSrc: Oral     SpO2: (!) 89% (!) 88% 91%  94%  Weight:      Height:        Constitutional: Acute alert and oriented x3, no associated distress.   Skin: no rashes, no lesions, notable poor skin turgor.   Eyes: Pupils are equally reactive to light.  No evidence of scleral icterus or conjunctival pallor.  ENMT: Moist mucous membranes noted.  Posterior pharynx clear of any exudate or lesions.   Neck: normal, supple, no masses, no thyromegaly.  No evidence of jugular venous distension.    Respiratory: Notable bilateral lower and mid field rales.  No evidence of associated wheezing.  Patient is somewhat tachypneic without accessory muscle use.  Cardiovascular: Regular rate and rhythm, no murmurs / rubs / gallops. No extremity edema. 2+ pedal pulses. No carotid bruits.  Chest:   Nontender without crepitus or deformity.   Back:   Nontender without crepitus or deformity. Abdomen: Abdomen is soft and nontender.  No evidence of intra-abdominal masses.  Positive bowel sounds noted in all quadrants.   Musculoskeletal: No joint deformity upper and lower extremities. Good ROM, no contractures. Normal muscle tone.  Neurologic: CN 2-12 grossly intact. Sensation intact.  Patient moving all 4 extremities spontaneously.  Patient is following all commands.  Patient is responsive to verbal stimuli.   Psychiatric: Patient exhibits normal mood with appropriate affect.  Patient seems to possess insight as to their current situation.     Labs on Admission: I have personally reviewed following labs and imaging studies -   CBC: Recent Labs  Lab 11/02/20 1813  WBC 5.8  NEUTROABS 4.0  HGB 13.6  HCT 43.0  MCV 87.0  PLT 280   Basic Metabolic Panel: Recent Labs  Lab 11/02/20 1813  NA 134*  K 3.6  CL 97*  CO2 23  GLUCOSE 113*  BUN 16  CREATININE 1.09*  CALCIUM 9.1   GFR: Estimated Creatinine Clearance: 45.8 mL/min (A) (by C-G formula based on SCr of 1.09 mg/dL (H)). Liver Function Tests: Recent Labs  Lab 11/02/20 1813  AST 41  ALT 43  ALKPHOS 52  BILITOT 0.8  PROT 8.0  ALBUMIN 3.6   No results for input(s): LIPASE, AMYLASE in the last 168 hours. No results for input(s): AMMONIA in the last 168 hours. Coagulation Profile: No results for input(s): INR, PROTIME in the last 168 hours. Cardiac Enzymes: No results for input(s): CKTOTAL, CKMB, CKMBINDEX, TROPONINI in the last 168 hours. BNP (last 3 results) No results for input(s): PROBNP in the last 8760 hours. HbA1C: No  results for input(s): HGBA1C in the last 72 hours. CBG: No results for input(s): GLUCAP in the last 168 hours. Lipid Profile: Recent Labs    11/02/20 1813  TRIG 99   Thyroid Function Tests: No results for input(s): TSH, T4TOTAL, FREET4, T3FREE, THYROIDAB in the last 72 hours. Anemia Panel: Recent Labs    11/02/20 1813  FERRITIN 382*   Urine analysis:    Component Value Date/Time   COLORURINE AMBER (A) 10/31/2017 2008   APPEARANCEUR HAZY (A) 10/31/2017 2008   LABSPEC 1.028 10/31/2017 2008   PHURINE 5.0 10/31/2017 2008   GLUCOSEU NEGATIVE 10/31/2017 2008   HGBUR NEGATIVE 10/31/2017 2008   BILIRUBINUR negative 01/11/2020 1323   BILIRUBINUR neg 09/06/2013 1018   KETONESUR negative 01/11/2020 1323   KETONESUR 5 (A) 10/31/2017 2008   PROTEINUR negative 01/11/2020 1323   PROTEINUR 100 (A) 10/31/2017 2008   UROBILINOGEN 0.2 01/11/2020 1323   NITRITE Negative 01/11/2020 1323   NITRITE NEGATIVE 10/31/2017 2008   LEUKOCYTESUR  Negative 01/11/2020 1323    Radiological Exams on Admission - Personally Reviewed: DG Chest 2 View  Result Date: 11/02/2020 CLINICAL DATA:  70 year old female with cough and fatigue for 10 days. Abnormal pulmonary auscultation. EXAM: CHEST - 2 VIEW COMPARISON:  Chest radiographs 11/10/2017 and earlier. FINDINGS: Mediastinal contours remain normal. Stable lung volumes since 2019 with evidence of chronic right lung base scarring and mild architectural distortion. Improved ventilation of the right costophrenic angle since 2019. Coarse bilateral pulmonary opacity elsewhere appears stable. Visualized tracheal air column is within normal limits. No pneumothorax, pleural effusion or acute pulmonary opacity. No acute osseous abnormality identified. Negative visible bowel gas pattern. IMPRESSION: Chronic lung disease not significantly changed from 2019 radiographs. No acute cardiopulmonary abnormality identified. Electronically Signed   By: Odessa Fleming M.D.   On: 11/02/2020  11:32   CT Angio Chest PE W and/or Wo Contrast  Result Date: 11/02/2020 CLINICAL DATA:  Possible COVID and decreased oxygen saturation. Positive D-dimer. EXAM: CT ANGIOGRAPHY CHEST WITH CONTRAST TECHNIQUE: Multidetector CT imaging of the chest was performed using the standard protocol during bolus administration of intravenous contrast. Multiplanar CT image reconstructions and MIPs were obtained to evaluate the vascular anatomy. CONTRAST:  12mL OMNIPAQUE IOHEXOL 350 MG/ML SOLN COMPARISON:  11/02/2020 chest radiograph. No prior CT. FINDINGS: Cardiovascular: The quality of this exam for evaluation of pulmonary embolism is good. No evidence of pulmonary embolism. Normal aortic caliber. Normal heart size, without pericardial effusion. Suggestion of left ventricular hypertrophy. Mediastinum/Nodes: Mild right hilar adenopathy at 1.5 cm. Left infrahilar node measures 1.0 cm. Tiny hiatal hernia. Lungs/Pleura: No pleural fluid. Mild peripheral and slightly basilar predominant airspace and ground-glass opacity. Upper Abdomen: Normal imaged portions of the liver, spleen, pancreas, adrenal glands, kidneys. Musculoskeletal: No acute osseous abnormality. Review of the MIP images confirms the above findings. IMPRESSION: 1. No evidence of pulmonary embolism. 2. Peripheral and slightly basilar predominant airspace and ground-glass opacity, most consistent with COVID-19 pneumonia. 3. Mild thoracic adenopathy, likely reactive. 4. Tiny hiatal hernia. 5. Suggestion of left ventricular hypertrophy. Electronically Signed   By: Jeronimo Greaves M.D.   On: 11/02/2020 20:57    EKG: Personally reviewed.  Rhythm is normal sinus rhythm with heart rate of 92 beats per minute.  No dynamic ST segment changes appreciated.  Assessment/Plan Principal Problem:   COVID-19 virus infection   Patient is presenting with an approximate 10-day history of progressively worsening generalized weakness poor appetite, inability to tolerate adequate oral  intake, intermittent subjective fevers and diarrhea.  Patient found to be COVID-19 PCR positive in the emergency department with evidence of bilateral groundglass infiltrates on CT imaging of the chest.  Additionally found to exhibit oxygen saturations of 89 to 90% requiring submental oxygen in the emergency department.  Considering patient's low saturations and advanced age hospitalization is warranted for continued close monitoring and treatment  Administering supplemental oxygen via nasal cannula to achieve oxygen saturations of 94% and higher.  Patient is already been initiated on intravenous remdesivir.  Considering patient been symptomatic for 10 days the efficacy of this in question however we will continue this for a 5-day course.  Additionally providing patient with intravenous Solu-Medrol  As needed bronchodilator therapy for shortness of breath and wheezing  As needed antitussives for cough  Providing patient with zinc and vitamin C supplementation  Airborne and contact precautions  Active Problems:   Lactic acidosis   Patient exhibiting a mild lactic acidosis of 2.3 on arrival  This is likely secondary to substantial volume  depletion and relative hypoxia  Providing patient with supplemental oxygen and hydrating patient with intravenous isotonic fluids  Performing serial lactic acid levels to ensure downtrending and resolution  Dehydration and hyponatremia   Patient exhibiting dry mucous membranes and poor skin turgor with a nearly 2-week history of poor oral intake concerning for dehydration  Patient is exhibiting mild associated hyponatremia likely secondary to volume depletion  Hydrating patient gently with intravenous isotonic fluids.  Monitoring sodium levels serial chemistries.    Hypothyroidism   Patient reports a longstanding history of hypothyroidism but states that in the past several years she has been taken off of her Synthroid due to having "a  reaction" with it although it is unclear what her reaction was.  We will start by obtaining a TSH    GERD without esophagitis    Patient has a known history of gastroesophageal reflux disease but is currently not on therapy.  Considering administration of systemic steroids we will go ahead and place patient on a PPI while here.   Code Status:  Full code Family Communication: Patient's daughter has been updated on plan of care by ER staff  Status is: Observation  The patient remains OBS appropriate and will d/c before 2 midnights.  Dispo: The patient is from: Home              Anticipated d/c is to: Home              Patient currently is not medically stable to d/c.   Difficult to place patient No        Marinda ElkGeorge J Niccolas Loeper MD [facility] Pager [phone]  If 7PM-7AM, please contact night-coverage [email or web address] Use universal [place] password for that web site. If you do not have the password, please call the hospital operator.  11/02/2020, 10:42 PM

## 2020-11-02 NOTE — ED Notes (Signed)
2L [place] applied to pt

## 2020-11-02 NOTE — ED Provider Notes (Signed)
[facility]    CSN: [id number] Arrival date & time: 11/02/20  8469      History   Chief Complaint Chief Complaint  Patient presents with  . Headache    HPI Rachael Powell is a 70 y.o. female presenting today for evaluation of cough and body aches.  Patient reports that symptoms began approximately 10 days ago on 2/1 4.  She has had associated headache, fevers up to 101.3, ear pain and coughing.  Denies any difficulty breathing shortness of breath or chest discomfort.  Denies history of asthma, tobacco use or lung problems.  She reports COVID exposure a few weeks prior to onset of symptoms.  Using essential oils, zinc, vitamin C and Tylenol without relief  HPI  Past Medical History:  Diagnosis Date  . Chicken pox   . Chronic kidney disease    right renal stone  . Colon polyps   . Depression    no medication  . Frequent headaches   . GERD (gastroesophageal reflux disease)   . Hypothyroidism    possible low thyroid. No meds  . Thyroid disease     Patient Active Problem List   Diagnosis Date Noted  . 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 Lobar pneumonia (HCC) 10/31/2017    Past Surgical History:  Procedure Laterality Date  . DILATION AND CURETTAGE OF UTERUS    . HAMMER TOE SURGERY    . TUBAL LIGATION      OB History   No obstetric history on file.      Home Medications    Prior to Admission medications   Medication Sig Start Date End Date Taking? Authorizing Provider  acetaminophen (TYLENOL 8 HOUR ARTHRITIS PAIN) 650 MG CR tablet Take 1 tablet (650 mg total) by mouth every 8 (eight) hours as needed for pain. 12/13/19   [name], MD  meloxicam (MOBIC) 7.5 MG tablet Take 1 tablet (7.5 mg total) by mouth daily as needed for pain. 01/13/19   Shade Flood, MD  methocarbamol (ROBAXIN) 500 MG tablet Take 1 tablet (500 mg total) by mouth 4 (four) times daily. 01/11/20   Royal Hawthorn, NP  methylPREDNISolone (MEDROL DOSEPAK) 4 MG TBPK  tablet As directed on pkg label 01/11/20   Royal Hawthorn, NP  tiZANidine (ZANAFLEX) 2 MG tablet Take 1 tablet (2 mg total) by mouth every 6 (six) hours as needed for muscle spasms. 01/13/19   Shade Flood, MD    Family History Family History  Problem Relation Age of Onset  . Diabetes Father     Social History Social History   Tobacco Use  . Smoking status: Former Smoker    Packs/day: 1.00    Years: 3.00    Pack years: 3.00    Types: Cigarettes  . Smokeless tobacco: Never Used  Vaping Use  . Vaping Use: Never used  Substance Use Topics  . Alcohol use: No  . Drug use: No     Allergies   Patient has no known allergies.   Review of Systems Review of Systems  Constitutional: Positive for chills. Negative for activity change, appetite change, fatigue and fever.  HENT: Positive for congestion. Negative for ear pain, rhinorrhea, sinus pressure, sore throat and trouble swallowing.   Eyes: Negative for discharge and redness.  Respiratory: Positive for cough. Negative for chest tightness and shortness of breath.   Cardiovascular: Negative for chest pain.  Gastrointestinal: Negative for abdominal pain, diarrhea, nausea and vomiting.  Musculoskeletal:  Positive for myalgias.  Skin: Negative for rash.  Neurological: Positive for headaches. Negative for dizziness and light-headedness.     Physical Exam Triage Vital Signs ED Triage Vitals  Enc Vitals Group     BP 11/02/20 1040 120/78     Pulse Rate 11/02/20 1040 93     Resp 11/02/20 1040 18     Temp 11/02/20 1040 99.4 F (37.4 C)     Temp Source 11/02/20 1040 Oral     SpO2 11/02/20 1040 (!) 83 %     Weight --      Height --      Head Circumference --      Peak Flow --      Pain Score 11/02/20 1051 9     Pain Loc --      Pain Edu? --      Excl. in GC? --    No data found.  Updated Vital Signs BP 120/78 (BP Location: Left Arm)   Pulse 93   Temp 99.4 F (37.4 C) (Oral)   Resp (!) 28   SpO2 92%   Visual  Acuity Right Eye Distance:   Left Eye Distance:   Bilateral Distance:    Right Eye Near:   Left Eye Near:    Bilateral Near:     Physical Exam Vitals and nursing note reviewed.  Constitutional:      Appearance: She is well-developed and well-nourished.     Comments: No acute distress  HENT:     Head: Normocephalic and atraumatic.     Ears:     Comments: Bilateral ears without tenderness to palpation of external auricle, tragus and mastoid, EAC's without erythema or swelling, TM's with good bony landmarks and cone of light. Non erythematous.     Nose: Nose normal.     Mouth/Throat:     Comments: Oral mucosa pink and moist, no tonsillar enlargement or exudate. Posterior pharynx patent and nonerythematous, no uvula deviation or swelling. Normal phonation. Eyes:     Conjunctiva/sclera: Conjunctivae normal.  Cardiovascular:     Rate and Rhythm: Normal rate and regular rhythm.  Pulmonary:     Effort: Pulmonary effort is normal. No respiratory distress.     Comments: Breathing comfortably at rest, CTABL, no wheezing, rales or other adventitious sounds auscultated Abdominal:     General: There is no distension.  Musculoskeletal:        General: Normal range of motion.     Cervical back: Neck supple.  Skin:    General: Skin is warm and dry.  Neurological:     Mental Status: She is alert and oriented to person, place, and time.  Psychiatric:        Mood and Affect: Mood and affect normal.      UC Treatments / Results  Labs (all labs ordered are listed, but only abnormal results are displayed) Labs Reviewed - No data to display  EKG   Radiology DG Chest 2 View  Result Date: 11/02/2020 CLINICAL DATA:  70 year old female with cough and fatigue for 10 days. Abnormal pulmonary auscultation. EXAM: CHEST - 2 VIEW COMPARISON:  Chest radiographs 11/10/2017 and earlier. FINDINGS: Mediastinal contours remain normal. Stable lung volumes since 2019 with evidence of chronic right lung  base scarring and mild architectural distortion. Improved ventilation of the right costophrenic angle since 2019. Coarse bilateral pulmonary opacity elsewhere appears stable. Visualized tracheal air column is within normal limits. No pneumothorax, pleural effusion or acute pulmonary opacity. No acute osseous abnormality  identified. Negative visible bowel gas pattern. IMPRESSION: Chronic lung disease not significantly changed from 2019 radiographs. No acute cardiopulmonary abnormality identified. Electronically Signed   By: Odessa Fleming M.D.   On: 11/02/2020 11:32    Procedures Procedures (including critical care time)  Medications Ordered in UC Medications - No data to display  Initial Impression / Assessment and Plan / UC Course  I have reviewed the triage vital signs and the nursing notes.  Pertinent labs & imaging results that were available during my care of the patient were reviewed by me and considered in my medical decision making (see chart for details).     X-ray without any acute abnormalities, area to right lower lung does appear slightly more hazy than left, not suggestive of an explanation of patient's hypoxia today, O2 did improve to greater than 95% on 2 to 3 L of O2, when decreased again O2 returned back to 88%.  Discussed with patient and daughter concern for hypoxia without great explanation.  Recommending further evaluation and work-up in emergency room.  Discussed strict return precautions. Patient verbalized understanding and is agreeable with plan.  Final Clinical Impressions(s) / UC Diagnoses   Final diagnoses:  Acute respiratory failure with hypoxia [facility])   Discharge Instructions   None    ED Prescriptions    None     PDMP not reviewed this encounter.   Lew Dawes, PA-C 11/02/20 1216

## 2020-11-02 NOTE — ED Triage Notes (Signed)
Patient presents to Urgent Care with complaints of headache, fever, bilateral ear pain, body aches, and cough. Pt states on 02/14 was onset for body aches. Other symptoms started on Monday. Treating symptoms with essential oils, zinc, vitamin C, tylenol.

## 2020-11-02 NOTE — ED Provider Notes (Signed)
[facility] EMERGENCY DEPARTMENT Provider Note   CSN: [id number] Arrival date & time: 11/02/20  1213     History No chief complaint on file.   Tavionna Grout is a 70 y.o. female.  The history is provided by the patient and medical records. No language interpreter was used.     70 year old female significant history history of CKD, GERD, thyroid disease, sent here for urgent care center with concerns of Covid infection.  Patient reports for the past 10 days she has had symptoms including fever, chills, body aches, generalized weakness, decrease in appetite, cough, and overall not feeling well.  She relates loss of taste smell and denies vomiting or diarrhea.  She has not been vaccinated for COVID-19.  Her daughter was sick with COVID recently.  She has been taking over-the-counter medication at home without adequate relief.  She was seen in urgent care center today and she was found to be hypoxic with O2 sats in the 88% on room air.  She was placed on supplemental oxygen and was sent here for further care.  Past Medical History:  Diagnosis Date  . Chicken pox   . Chronic kidney disease    right renal stone  . Colon polyps   . Depression    no medication  . Frequent headaches   . GERD (gastroesophageal reflux disease)   . Hypothyroidism    possible low thyroid. No meds  . Thyroid disease     Patient Active Problem List   Diagnosis Date Noted  . 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 Lobar pneumonia (HCC) 10/31/2017    Past Surgical History:  Procedure Laterality Date  . DILATION AND CURETTAGE OF UTERUS    . HAMMER TOE SURGERY    . TUBAL LIGATION       OB History   No obstetric history on file.     Family History  Problem Relation Age of Onset  . Diabetes Father     Social History   Tobacco Use  . Smoking status: Former Smoker    Packs/day: 1.00    Years: 3.00    Pack years: 3.00    Types: Cigarettes  . Smokeless tobacco: Never Used   Vaping Use  . Vaping Use: Never used  Substance Use Topics  . Alcohol use: No  . Drug use: No    Home Medications Prior to Admission medications   Medication Sig Start Date End Date Taking? Authorizing Provider  acetaminophen (TYLENOL 8 HOUR ARTHRITIS PAIN) 650 MG CR tablet Take 1 tablet (650 mg total) by mouth every 8 (eight) hours as needed for pain. 12/13/19   [name], MD  meloxicam (MOBIC) 7.5 MG tablet Take 1 tablet (7.5 mg total) by mouth daily as needed for pain. 01/13/19   Shade Flood, MD  methocarbamol (ROBAXIN) 500 MG tablet Take 1 tablet (500 mg total) by mouth 4 (four) times daily. 01/11/20   Royal Hawthorn, NP  methylPREDNISolone (MEDROL DOSEPAK) 4 MG TBPK tablet As directed on pkg label 01/11/20   Royal Hawthorn, NP  tiZANidine (ZANAFLEX) 2 MG tablet Take 1 tablet (2 mg total) by mouth every 6 (six) hours as needed for muscle spasms. 01/13/19   Shade Flood, MD    Allergies    Patient has no known allergies.  Review of Systems   Review of Systems  All other systems reviewed and are negative.   Physical Exam Updated Vital Signs BP 119/79  Pulse 96   Temp 100.3 F (37.9 C) (Oral)   Resp 17   SpO2 96%   Physical Exam Vitals and nursing note reviewed.  Constitutional:      Appearance: She is well-developed and well-nourished. She is obese.     Comments: Appears fatigued and mildly tachypneic  HENT:     Head: Atraumatic.  Eyes:     Conjunctiva/sclera: Conjunctivae normal.  Cardiovascular:     Rate and Rhythm: Normal rate and regular rhythm.     Pulses: Normal pulses.     Heart sounds: Normal heart sounds.  Pulmonary:     Breath sounds: Rales (Crackles heard at lung base no wheezes or rhonchi.) present.  Abdominal:     Palpations: Abdomen is soft.     Tenderness: There is no abdominal tenderness.  Musculoskeletal:     Cervical back: Neck supple.     Comments: Globally weak but with equal strength in all 4 extremities  Skin:     Findings: No rash.  Neurological:     Mental Status: She is alert and oriented to person, place, and time.  Psychiatric:        Mood and Affect: Mood and affect and mood normal.     ED Results / Procedures / Treatments   Labs (all labs ordered are listed, but only abnormal results are displayed) Labs Reviewed  RESP PANEL BY RT-PCR (FLU A&B, COVID) ARPGX2 - Abnormal; Notable for the following components:      Result Value   SARS Coronavirus 2 by RT PCR POSITIVE (*)    All other components within normal limits  LACTIC ACID, PLASMA - Abnormal; Notable for the following components:   Lactic Acid, Venous 2.3 (*)    All other components within normal limits  CBC WITH DIFFERENTIAL/PLATELET - Abnormal; Notable for the following components:   Abs Immature Granulocytes 0.10 (*)    All other components within normal limits  COMPREHENSIVE METABOLIC PANEL - Abnormal; Notable for the following components:   Sodium 134 (*)    Chloride 97 (*)    Glucose, Bld 113 (*)    Creatinine, Ser 1.09 (*)    GFR, Estimated 54 (*)    All other components within normal limits  D-DIMER, QUANTITATIVE - Abnormal; Notable for the following components:   D-Dimer, Quant 3.61 (*)    All other components within normal limits  LACTATE DEHYDROGENASE - Abnormal; Notable for the following components:   LDH 319 (*)    All other components within normal limits  FERRITIN - Abnormal; Notable for the following components:   Ferritin 382 (*)    All other components within normal limits  FIBRINOGEN - Abnormal; Notable for the following components:   Fibrinogen 690 (*)    All other components within normal limits  C-REACTIVE PROTEIN - Abnormal; Notable for the following components:   CRP 11.9 (*)    All other components within normal limits  CULTURE, BLOOD (ROUTINE X 2)  CULTURE, BLOOD (ROUTINE X 2)  TRIGLYCERIDES  LACTIC ACID, PLASMA  PROCALCITONIN    EKG EKG Interpretation  Date/Time:  Thursday November 02 2020 17:59:32 EST Ventricular Rate:  92 PR Interval:    QRS Duration: 78 QT Interval:  326 QTC Calculation: 404 R Axis:   -26 Text Interpretation: Sinus rhythm Abnormal R-wave progression, early transition LVH with secondary repolarization abnormality NSR,  no change from previous Confirmed by Coralee Pesa [phone]) on 11/02/2020 8:03:19 PM   Radiology DG Chest 2 View  Result  Date: 11/02/2020 CLINICAL DATA:  70 year old female with cough and fatigue for 10 days. Abnormal pulmonary auscultation. EXAM: CHEST - 2 VIEW COMPARISON:  Chest radiographs 11/10/2017 and earlier. FINDINGS: Mediastinal contours remain normal. Stable lung volumes since 2019 with evidence of chronic right lung base scarring and mild architectural distortion. Improved ventilation of the right costophrenic angle since 2019. Coarse bilateral pulmonary opacity elsewhere appears stable. Visualized tracheal air column is within normal limits. No pneumothorax, pleural effusion or acute pulmonary opacity. No acute osseous abnormality identified. Negative visible bowel gas pattern. IMPRESSION: Chronic lung disease not significantly changed from 2019 radiographs. No acute cardiopulmonary abnormality identified. Electronically Signed   By: Odessa Fleming M.D.   On: 11/02/2020 11:32    Procedures .Critical Care Performed by: Fayrene Helper, PA-C Authorized by: Fayrene Helper, PA-C   Critical care provider statement:    Critical care time (minutes):  45   Critical care was time spent personally by me on the following activities:  Discussions with consultants, evaluation of patient's response to treatment, examination of patient, ordering and performing treatments and interventions, ordering and review of laboratory studies, ordering and review of radiographic studies, pulse oximetry, re-evaluation of patient's condition, obtaining history from patient or surrogate and review of old charts     Medications Ordered in ED Medications  sodium  chloride flush (NS) 0.9 % injection 3 mL (has no administration in time range)  sodium chloride flush (NS) 0.9 % injection 3 mL (has no administration in time range)  0.9 %  sodium chloride infusion (has no administration in time range)  dexamethasone (DECADRON) injection 6 mg (has no administration in time range)  remdesivir 200 mg in sodium chloride 0.9% 250 mL IVPB (has no administration in time range)    Followed by  remdesivir 100 mg in sodium chloride 0.9 % 100 mL IVPB (has no administration in time range)  acetaminophen (TYLENOL) tablet 650 mg (has no administration in time range)    ED Course  I have reviewed the triage vital signs and the nursing notes.  Pertinent labs & imaging results that were available during my care of the patient were reviewed by me and considered in my medical decision making (see chart for details).    MDM Rules/Calculators/A&P                          BP 121/70   Pulse 92   Temp 100.3 F (37.9 C) (Oral)   Resp (!) 24   SpO2 90%   Final Clinical Impression(s) / ED Diagnoses Final diagnoses:  Acute hypoxemic respiratory failure due to COVID-19 [facility])    Rx / DC Orders ED Discharge Orders    None     6:14 PM Patient presents with COVID symptoms for the past 10 days after a positive Covid exposure.  Patient is unvaccinated.  She was sent here from urgent care center when she was found to be hypoxic with O2 sats 88% on room air.  Patient is currently on 2 L satting at 96%.  Her Covid test is positive.  Work-up initiated.  8:01 PM Elevated lactic acid of 2.3, elevated inflammatory markers as well.  Her D-dimer is 3.61, Covid test is positive, chest x-ray performed at the urgent care facility without concerning finding.  Patient has a documented oxygen of 83% on room air here, she has been placed on 2 L of supplemental oxygen  Appreciate consultation with [facility] hospitalist, Dr. Leafy Half, who agrees to  see and admit patient.  He did request for  chest CT angiogram to rule out PE.  I have also initiated remdesivir and Decadron.  Care discussed with Dr. Wilkie AyeHOrton.   Kathryne ErikssonRochelle Diane Donnell was evaluated in Emergency Department on 11/02/2020 for the symptoms described in the history of present illness. She was evaluated in the context of the global COVID-19 pandemic, which necessitated consideration that the patient might be at risk for infection with the SARS-CoV-2 virus that causes COVID-19. Institutional protocols and algorithms that pertain to the evaluation of patients at risk for COVID-19 are in a state of rapid change based on information released by regulatory bodies including the CDC and federal and state organizations. These policies and algorithms were followed during the patient's care in the ED.    Fayrene Helperran, [place], PA-C 11/02/20 2004    Rozelle LoganHorton, Kristie M, DO 11/03/20 0011

## 2020-11-03 DIAGNOSIS — J9601 Acute respiratory failure with hypoxia: Secondary | ICD-10-CM

## 2020-11-03 DIAGNOSIS — K219 Gastro-esophageal reflux disease without esophagitis: Secondary | ICD-10-CM | POA: Diagnosis present

## 2020-11-03 DIAGNOSIS — Z8719 Personal history of other diseases of the digestive system: Secondary | ICD-10-CM | POA: Diagnosis not present

## 2020-11-03 DIAGNOSIS — U071 COVID-19: Secondary | ICD-10-CM | POA: Diagnosis present

## 2020-11-03 DIAGNOSIS — Z833 Family history of diabetes mellitus: Secondary | ICD-10-CM | POA: Diagnosis not present

## 2020-11-03 DIAGNOSIS — E876 Hypokalemia: Secondary | ICD-10-CM | POA: Diagnosis present

## 2020-11-03 DIAGNOSIS — E872 Acidosis: Secondary | ICD-10-CM | POA: Diagnosis present

## 2020-11-03 DIAGNOSIS — E039 Hypothyroidism, unspecified: Secondary | ICD-10-CM | POA: Diagnosis present

## 2020-11-03 DIAGNOSIS — J1282 Pneumonia due to coronavirus disease 2019: Secondary | ICD-10-CM | POA: Diagnosis present

## 2020-11-03 DIAGNOSIS — Z87442 Personal history of urinary calculi: Secondary | ICD-10-CM | POA: Diagnosis not present

## 2020-11-03 DIAGNOSIS — Z87891 Personal history of nicotine dependence: Secondary | ICD-10-CM | POA: Diagnosis not present

## 2020-11-03 DIAGNOSIS — E871 Hypo-osmolality and hyponatremia: Secondary | ICD-10-CM | POA: Diagnosis present

## 2020-11-03 DIAGNOSIS — E860 Dehydration: Secondary | ICD-10-CM | POA: Diagnosis present

## 2020-11-03 DIAGNOSIS — F32A Depression, unspecified: Secondary | ICD-10-CM | POA: Diagnosis present

## 2020-11-03 DIAGNOSIS — Z79899 Other long term (current) drug therapy: Secondary | ICD-10-CM | POA: Diagnosis not present

## 2020-11-03 DIAGNOSIS — R509 Fever, unspecified: Secondary | ICD-10-CM | POA: Diagnosis present

## 2020-11-03 LAB — COMPREHENSIVE METABOLIC PANEL
ALT: 35 U/L (ref 0–44)
AST: 35 U/L (ref 15–41)
Albumin: 2.8 g/dL — ABNORMAL LOW (ref 3.5–5.0)
Alkaline Phosphatase: 42 U/L (ref 38–126)
Anion gap: 9 (ref 5–15)
BUN: 17 mg/dL (ref 8–23)
CO2: 24 mmol/L (ref 22–32)
Calcium: 8.5 mg/dL — ABNORMAL LOW (ref 8.9–10.3)
Chloride: 102 mmol/L (ref 98–111)
Creatinine, Ser: 0.85 mg/dL (ref 0.44–1.00)
GFR, Estimated: >60 mL/min (ref >60)
Glucose, Bld: 258 mg/dL — ABNORMAL HIGH (ref 70–99)
Potassium: 3.7 mmol/L (ref 3.5–5.1)
Sodium: 135 mmol/L (ref 135–145)
Total Bilirubin: 0.8 mg/dL (ref 0.3–1.2)
Total Protein: 6.4 g/dL — ABNORMAL LOW (ref 6.5–8.1)

## 2020-11-03 LAB — CBC WITH DIFFERENTIAL/PLATELET
Abs Immature Granulocytes: 0.03 10*3/uL (ref 0.00–0.07)
Basophils Absolute: 0 10*3/uL (ref 0.0–0.1)
Basophils Relative: 1 %
Eosinophils Absolute: 0 10*3/uL (ref 0.0–0.5)
Eosinophils Relative: 0 %
HCT: 36.9 % (ref 36.0–46.0)
Hemoglobin: 12.4 g/dL (ref 12.0–15.0)
Immature Granulocytes: 1 %
Lymphocytes Relative: 24 %
Lymphs Abs: 1 10*3/uL (ref 0.7–4.0)
MCH: 28.2 pg (ref 26.0–34.0)
MCHC: 33.6 g/dL (ref 30.0–36.0)
MCV: 83.9 fL (ref 80.0–100.0)
Monocytes Absolute: 0.3 10*3/uL (ref 0.1–1.0)
Monocytes Relative: 7 %
Neutro Abs: 2.7 10*3/uL (ref 1.7–7.7)
Neutrophils Relative %: 67 %
Platelets: 281 10*3/uL (ref 150–400)
RBC: 4.4 MIL/uL (ref 3.87–5.11)
RDW: 12.8 % (ref 11.5–15.5)
WBC: 4.1 10*3/uL (ref 4.0–10.5)
nRBC: 0 % (ref 0.0–0.2)

## 2020-11-03 LAB — D-DIMER, QUANTITATIVE: D-Dimer, Quant: 3.43 ug/mL-FEU — ABNORMAL HIGH (ref 0.00–0.50)

## 2020-11-03 LAB — LACTIC ACID, PLASMA: Lactic Acid, Venous: 1.2 mmol/L (ref 0.5–1.9)

## 2020-11-03 LAB — C-REACTIVE PROTEIN: CRP: 10.5 mg/dL — ABNORMAL HIGH (ref <1.0)

## 2020-11-03 LAB — MAGNESIUM: Magnesium: 2.1 mg/dL (ref 1.7–2.4)

## 2020-11-03 MED ORDER — SALINE SPRAY 0.65 % NA SOLN
1.0000 | NASAL | Status: DC | PRN
  Filled 2020-11-03: qty 44

## 2020-11-03 MED ORDER — BOOST / RESOURCE BREEZE PO LIQD CUSTOM
1.0000 | Freq: Three times a day (TID) | ORAL | Status: DC
  Administered 2020-11-03 – 2020-11-06 (×7): 1 via ORAL

## 2020-11-03 NOTE — Progress Notes (Signed)
Initial Nutrition Assessment  DOCUMENTATION CODES:   Not applicable  INTERVENTION:  Provide Ensure Enlive po TID, each supplement provides 350 kcal and 20 grams of protein.  Encourage adequate PO intake.   NUTRITION DIAGNOSIS:   Increased nutrient needs related to catabolic illness (COVID) as evidenced by estimated needs.  GOAL:   Patient will meet greater than or equal to 90% of their needs  MONITOR:   PO intake,Supplement acceptance,Skin,Weight trends,Labs,I & O's  REASON FOR ASSESSMENT:   Malnutrition Screening Tool    ASSESSMENT:   70 y.o. female with PMHx of GERD, hypothyroidism-presented with shortness of breath-found to have acute hypoxic respiratory failure due to COVID-19 pneumonia.  Pt unavailable during attempted time of contact. RD unable to obtain pt nutrition history at this time. Per MD, pt with poor appetite. Pt currently has Ensure ordered and has been refusing them. RD to modify nutritional supplement orders and order Boost Breeze instead to aid in caloric and protein needs.   Unable to complete Nutrition-Focused physical exam at this time.   Labs and medications reviewed.   Diet Order:   Diet Order            Diet regular Room service appropriate? Yes; Fluid consistency: Thin  Diet effective now                 EDUCATION NEEDS:   Not appropriate for education at this time  Skin:  Skin Assessment: Reviewed RN Assessment  Last BM:  2/24  Height:   Ht Readings from Last 1 Encounters:  11/02/20 5\' 4"  (1.626 m)    Weight:   Wt Readings from Last 1 Encounters:  11/02/20 71.2 kg   BMI:  Body mass index is 26.95 kg/m.  Estimated Nutritional Needs:   Kcal:  1800-2000  Protein:  85-95 grams  Fluid:  >/= 1.8 L/day  11/04/20, MS, RD, LDN RD pager number/after hours weekend pager number on Amion.

## 2020-11-03 NOTE — Progress Notes (Addendum)
PROGRESS NOTE                                                                                                                                                                                                             Patient Demographics:    Rachael Powell, is a 70 y.o. female, DOB - Apr 19, 1951, RKY:[id number]  Outpatient Primary MD for the patient is [name], MD   Admit date - 11/02/2020   LOS - 0  No chief complaint on file.      Brief Narrative: Patient is a 70 y.o. female with PMHx of GERD, hypothyroidism-presented with shortness of breath-found to have acute hypoxic respiratory failure due to COVID-19 pneumonia.   COVID-19 vaccinated status: Unvaccinated  Significant Events: 2/24>> Admit to [facility] for shortness of breath-found to have hypoxia due to COVID-19  Significant studies: 2/24>>Chest x-ray: No PE-groundglass opacities bilaterally  COVID-19 medications: Steroids: 2/24>> Remdesivir: 2/24>>  Antibiotics: None  Microbiology data: 2/24>> blood culture: No growth  Procedures: None  Consults: None  DVT prophylaxis: enoxaparin (LOVENOX) injection 40 mg Start: 11/02/20 2100    Subjective:   Claims she feels better.  When asked she was vaccinated-"it does not work"-"I know a few people that have gotten Covid in spite of being vaccinated".  Upset that she has been getting SQ Lovenox "I do not want it-it hurts too much"   Assessment  & Plan :   Acute Hypoxic Resp Failure due to Covid 19 Viral pneumonia: Mild hypoxemia-attempt to titrate off oxygen today-continue steroid/Remdesivir.  No signs of volume overload-does not require diuretics.  Fever: afebrileO2 requirements:  SpO2: 92 % O2 Flow Rate (L/min): 2 L/min   COVID-19 Labs: Recent Labs    11/02/20 1813 11/03/20 0228  DDIMER 3.61* 3.43*  FERRITIN 382*  --   LDH 319*  --   CRP 11.9* 10.5*    No results found for: BNP  Recent  Labs  Lab 11/02/20 1813  PROCALCITON <0.10    Lab Results  Component Value Date   SARSCOV2NAA POSITIVE (A) 11/02/2020    Prone/Incentive Spirometry: encouraged  incentive spirometry use 3-4/hour.  Elevated D-dimer: Due to COVID-19 related inflammation-refusing to take prophylactic Lovenox due to pain.  This MD explained the rationale for prophylactic anticoagulation-risks of life-threatening VTE-but still refusing.  Dehydration/hyponatremia: Improved-saline lock IV fluids.  GERD: Continue PPI  Hypothyroidism: TSH within normal limits-Per patient no longer on Synthroid due to an unspecified drug reaction.   GI prophylaxis: PPI  ABG:    Component Value Date/Time   TCO2 24 11/10/2017 1115    Vent Settings: N/A  Condition - Stable  Family Communication  :  Daughter-[place]-[phone] updated over the phone 2/25  Code Status :  Full Code  Diet :  Diet Order            Diet regular Room service appropriate? Yes; Fluid consistency: Thin  Diet effective now                  Disposition Plan  :   Status is: Observation  The patient will require care spanning > 2 midnights and should be moved to inpatient because: Inpatient level of care appropriate due to severity of illness  Dispo: The patient is from: Home              Anticipated d/c is to: Home              Patient currently is not medically stable to d/c.   Difficult to place patient No   Barriers to discharge: Hypoxia requiring O2 supplementation/complete 5 days of IV Remdesivir  Antimicorbials  :    Anti-infectives (From admission, onward)   Start     Dose/Rate Route Frequency Ordered Stop   11/03/20 1000  remdesivir 100 mg in sodium chloride 0.9 % 100 mL IVPB       "Followed by" Linked Group Details   100 mg 200 mL/hr over 30 Minutes Intravenous Daily 11/02/20 1826 11/07/20 0959   11/02/20 1830  remdesivir 200 mg in sodium chloride 0.9% 250 mL IVPB       "Followed by" Linked Group Details   200  mg 580 mL/hr over 30 Minutes Intravenous Once 11/02/20 1826 11/02/20 2126      Inpatient Medications  Scheduled Meds: . vitamin C  500 mg Oral Daily  . enoxaparin (LOVENOX) injection  40 mg Subcutaneous Q24H  . feeding supplement  237 mL Oral BID BM  . methylPREDNISolone (SOLU-MEDROL) injection  0.5 mg/kg Intravenous Q12H   Followed by  . [START ON 11/06/2020] predniSONE  50 mg Oral Daily  . pantoprazole  40 mg Oral Daily  . sodium chloride flush  3 mL Intravenous Q12H  . zinc sulfate  220 mg Oral Daily   Continuous Infusions: . sodium chloride    . remdesivir 100 mg in NS 100 mL 100 mg (11/03/20 0825)   PRN Meds:.sodium chloride, acetaminophen, albuterol, chlorpheniramine-HYDROcodone, ondansetron **OR** ondansetron (ZOFRAN) IV, polyethylene glycol, sodium chloride, sodium chloride flush   Time Spent in minutes  25  See all Orders from today for further details   Jeoffrey MassedShanker Willia Genrich M.D on 11/03/2020 at 1:21 PM  To page go to [email or web address] - use universal password  [facility] -  Office  [phone]    Objective:   Vitals:   11/03/20 0746 11/03/20 0800 11/03/20 1000 11/03/20 1119  BP: 121/75   111/66  Pulse: 73   75  Resp: 20   19  Temp: 98.1 F (36.7 C)   99.1 F (37.3 C)  TempSrc: Oral   Axillary  SpO2: 93% 95% 91% 92%  Weight:      Height:        Wt Readings from Last 3 Encounters:  11/02/20 71.2 kg  01/11/20 80.9 kg  12/13/19 77.6 kg     Intake/Output Summary (Last 24 hours) at  11/03/2020 1321 Last data filed at 11/02/2020 2336 Gross per 24 hour  Intake 744.38 ml  Output --  Net 744.38 ml     Physical Exam Gen Exam:Alert awake-not in any distress HEENT:atraumatic, normocephalic Chest: B/L clear to auscultation anteriorly CVS:S1S2 regular Abdomen:soft non tender, non distended Extremities:no edema Neurology: Non focal Skin: no rash   Data Review:    CBC Recent Labs  Lab 11/02/20 1813 11/03/20 0228  WBC 5.8 4.1  HGB 13.6 12.4   HCT 43.0 36.9  PLT 280 281  MCV 87.0 83.9  MCH 27.5 28.2  MCHC 31.6 33.6  RDW 12.9 12.8  LYMPHSABS 1.4 1.0  MONOABS 0.3 0.3  EOSABS 0.0 0.0  BASOSABS 0.0 0.0    Chemistries  Recent Labs  Lab 11/02/20 1813 11/03/20 0228  NA 134* 135  K 3.6 3.7  CL 97* 102  CO2 23 24  GLUCOSE 113* 258*  BUN 16 17  CREATININE 1.09* 0.85  CALCIUM 9.1 8.5*  MG  --  2.1  AST 41 35  ALT 43 35  ALKPHOS 52 42  BILITOT 0.8 0.8   ------------------------------------------------------------------------------------------------------------------ Recent Labs    11/02/20 1813  TRIG 99    Lab Results  Component Value Date   HGBA1C 6.3 07/27/2013   ------------------------------------------------------------------------------------------------------------------ Recent Labs    11/02/20 1813  TSH 3.850   ------------------------------------------------------------------------------------------------------------------ Recent Labs    11/02/20 1813  FERRITIN 382*    Coagulation profile No results for input(s): INR, PROTIME in the last 168 hours.  Recent Labs    11/02/20 1813 11/03/20 0228  DDIMER 3.61* 3.43*    Cardiac Enzymes No results for input(s): CKMB, TROPONINI, MYOGLOBIN in the last 168 hours.  Invalid input(s): CK ------------------------------------------------------------------------------------------------------------------ No results found for: BNP  Micro Results Recent Results (from the past 240 hour(s))  Resp Panel by RT-PCR (Flu A&B, Covid) Nasopharyngeal Swab     Status: Abnormal   Collection Time: 11/02/20 12:57 PM   Specimen: Nasopharyngeal Swab; Nasopharyngeal(NP) swabs in vial transport medium  Result Value Ref Range Status   SARS Coronavirus 2 by RT PCR POSITIVE (A) NEGATIVE Final    Comment: RESULT CALLED TO, READ BACK BY AND VERIFIED WITHDallie Piles RN 1433 11/02/20 A BROWNING (NOTE) SARS-CoV-2 target nucleic acids are DETECTED.  The SARS-CoV-2 RNA is  generally detectable in upper respiratory specimens during the acute phase of infection. Positive results are indicative of the presence of the identified virus, but do not rule out bacterial infection or co-infection with other pathogens not detected by the test. Clinical correlation with patient history and other diagnostic information is necessary to determine patient infection status. The expected result is Negative.  Fact Sheet for Patients: BloggerCourse.com  Fact Sheet for Healthcare Providers: SeriousBroker.it  This test is not yet approved or cleared by the [place] FDA and  has been authorized for detection and/or diagnosis of SARS-CoV-2 by FDA under an Emergency Use Authorization (EUA).  This EUA will remain in effect (meaning this test can b e used) for the duration of  the COVID-19 declaration under Section 564(b)(1) of the Act, 21 U.S.C. section 360bbb-3(b)(1), unless the authorization is terminated or revoked sooner.     Influenza A by PCR NEGATIVE NEGATIVE Final   Influenza B by PCR NEGATIVE NEGATIVE Final    Comment: (NOTE) The Xpert Xpress SARS-CoV-2/FLU/RSV plus assay is intended as an aid in the diagnosis of influenza from Nasopharyngeal swab specimens and should not be used as a sole basis for treatment. Nasal washings  and aspirates are unacceptable for Xpert Xpress SARS-CoV-2/FLU/RSV testing.  Fact Sheet for Patients: BloggerCourse.com  Fact Sheet for Healthcare Providers: SeriousBroker.it  This test is not yet approved or cleared by the [place] FDA and has been authorized for detection and/or diagnosis of SARS-CoV-2 by FDA under an Emergency Use Authorization (EUA). This EUA will remain in effect (meaning this test can be used) for the duration of the COVID-19 declaration under Section 564(b)(1) of the Act, 21 U.S.C. section 360bbb-3(b)(1),  unless the authorization is terminated or revoked.  Performed at [facility], [address], [place], [place] [address]   Blood Culture (routine x 2)     Status: None (Preliminary result)   Collection Time: 11/02/20  6:13 PM   Specimen: BLOOD  Result Value Ref Range Status   Specimen Description BLOOD LEFT ANTECUBITAL  Final   Special Requests   Final    BOTTLES DRAWN AEROBIC AND ANAEROBIC Blood Culture adequate volume   Culture   Final    NO GROWTH < 24 HOURS Performed at [facility], [address] [place], [place] [address]    Report Status PENDING  Incomplete  Blood Culture (routine x 2)     Status: None (Preliminary result)   Collection Time: 11/02/20  6:25 PM   Specimen: BLOOD LEFT HAND  Result Value Ref Range Status   Specimen Description BLOOD LEFT HAND  Final   Special Requests   Final    BOTTLES DRAWN AEROBIC AND ANAEROBIC Blood Culture adequate volume   Culture   Final    NO GROWTH < 24 HOURS Performed at [facility], [address] [place], [place] [address]    Report Status PENDING  Incomplete    Radiology Reports DG Chest 2 View  Result Date: 11/02/2020 CLINICAL DATA:  70 year old female with cough and fatigue for 10 days. Abnormal pulmonary auscultation. EXAM: CHEST - 2 VIEW COMPARISON:  Chest radiographs 11/10/2017 and earlier. FINDINGS: Mediastinal contours remain normal. Stable lung volumes since 2019 with evidence of chronic right lung base scarring and mild architectural distortion. Improved ventilation of the right costophrenic angle since 2019. Coarse bilateral pulmonary opacity elsewhere appears stable. Visualized tracheal air column is within normal limits. No pneumothorax, pleural effusion or acute pulmonary opacity. No acute osseous abnormality identified. Negative visible bowel gas pattern. IMPRESSION: Chronic lung disease not significantly changed from 2019 radiographs. No acute cardiopulmonary abnormality identified.  Electronically Signed   By: Odessa Fleming M.D.   On: 11/02/2020 11:32   CT Angio Chest PE W and/or Wo Contrast  Result Date: 11/02/2020 CLINICAL DATA:  Possible COVID and decreased oxygen saturation. Positive D-dimer. EXAM: CT ANGIOGRAPHY CHEST WITH CONTRAST TECHNIQUE: Multidetector CT imaging of the chest was performed using the standard protocol during bolus administration of intravenous contrast. Multiplanar CT image reconstructions and MIPs were obtained to evaluate the vascular anatomy. CONTRAST:  64mL OMNIPAQUE IOHEXOL 350 MG/ML SOLN COMPARISON:  11/02/2020 chest radiograph. No prior CT. FINDINGS: Cardiovascular: The quality of this exam for evaluation of pulmonary embolism is good. No evidence of pulmonary embolism. Normal aortic caliber. Normal heart size, without pericardial effusion. Suggestion of left ventricular hypertrophy. Mediastinum/Nodes: Mild right hilar adenopathy at 1.5 cm. Left infrahilar node measures 1.0 cm. Tiny hiatal hernia. Lungs/Pleura: No pleural fluid. Mild peripheral and slightly basilar predominant airspace and ground-glass opacity. Upper Abdomen: Normal imaged portions of the liver, spleen, pancreas, adrenal glands, kidneys. Musculoskeletal: No acute osseous abnormality. Review of the MIP images confirms the  above findings. IMPRESSION: 1. No evidence of pulmonary embolism. 2. Peripheral and slightly basilar predominant airspace and ground-glass opacity, most consistent with COVID-19 pneumonia. 3. Mild thoracic adenopathy, likely reactive. 4. Tiny hiatal hernia. 5. Suggestion of left ventricular hypertrophy. Electronically Signed   By: Jeronimo Greaves M.D.   On: 11/02/2020 20:57

## 2020-11-04 DIAGNOSIS — U071 COVID-19: Secondary | ICD-10-CM | POA: Diagnosis not present

## 2020-11-04 LAB — HEMOGLOBIN A1C
Hgb A1c MFr Bld: 6.6 % — ABNORMAL HIGH (ref 4.8–5.6)
Mean Plasma Glucose: 142.72 mg/dL

## 2020-11-04 LAB — CBC WITH DIFFERENTIAL/PLATELET
Abs Immature Granulocytes: 0.04 10*3/uL (ref 0.00–0.07)
Basophils Absolute: 0 10*3/uL (ref 0.0–0.1)
Basophils Relative: 0 %
Eosinophils Absolute: 0 10*3/uL (ref 0.0–0.5)
Eosinophils Relative: 0 %
HCT: 38 % (ref 36.0–46.0)
Hemoglobin: 12.8 g/dL (ref 12.0–15.0)
Immature Granulocytes: 1 %
Lymphocytes Relative: 16 %
Lymphs Abs: 1.1 10*3/uL (ref 0.7–4.0)
MCH: 28.1 pg (ref 26.0–34.0)
MCHC: 33.7 g/dL (ref 30.0–36.0)
MCV: 83.3 fL (ref 80.0–100.0)
Monocytes Absolute: 0.7 10*3/uL (ref 0.1–1.0)
Monocytes Relative: 10 %
Neutro Abs: 5.1 10*3/uL (ref 1.7–7.7)
Neutrophils Relative %: 73 %
Platelets: 373 10*3/uL (ref 150–400)
RBC: 4.56 MIL/uL (ref 3.87–5.11)
RDW: 12.8 % (ref 11.5–15.5)
WBC: 7 10*3/uL (ref 4.0–10.5)
nRBC: 0 % (ref 0.0–0.2)

## 2020-11-04 LAB — COMPREHENSIVE METABOLIC PANEL
ALT: 41 U/L (ref 0–44)
AST: 35 U/L (ref 15–41)
Albumin: 2.9 g/dL — ABNORMAL LOW (ref 3.5–5.0)
Alkaline Phosphatase: 48 U/L (ref 38–126)
Anion gap: 11 (ref 5–15)
BUN: 22 mg/dL (ref 8–23)
CO2: 24 mmol/L (ref 22–32)
Calcium: 8.6 mg/dL — ABNORMAL LOW (ref 8.9–10.3)
Chloride: 103 mmol/L (ref 98–111)
Creatinine, Ser: 0.96 mg/dL (ref 0.44–1.00)
GFR, Estimated: >60 mL/min (ref >60)
Glucose, Bld: 207 mg/dL — ABNORMAL HIGH (ref 70–99)
Potassium: 3.6 mmol/L (ref 3.5–5.1)
Sodium: 138 mmol/L (ref 135–145)
Total Bilirubin: 0.6 mg/dL (ref 0.3–1.2)
Total Protein: 6.8 g/dL (ref 6.5–8.1)

## 2020-11-04 LAB — GLUCOSE, CAPILLARY
Glucose-Capillary: 396 mg/dL — ABNORMAL HIGH (ref 70–99)
Glucose-Capillary: 373 mg/dL — ABNORMAL HIGH (ref 70–99)

## 2020-11-04 LAB — MAGNESIUM: Magnesium: 2.1 mg/dL (ref 1.7–2.4)

## 2020-11-04 LAB — C-REACTIVE PROTEIN: CRP: 5.8 mg/dL — ABNORMAL HIGH (ref <1.0)

## 2020-11-04 LAB — D-DIMER, QUANTITATIVE: D-Dimer, Quant: 3.29 ug/mL-FEU — ABNORMAL HIGH (ref 0.00–0.50)

## 2020-11-04 MED ORDER — METHYLPREDNISOLONE SODIUM SUCC 40 MG IJ SOLR
40.0000 mg | Freq: Every day | INTRAMUSCULAR | Status: DC
  Administered 2020-11-05 – 2020-11-06 (×2): 40 mg via INTRAVENOUS
  Filled 2020-11-04 (×2): qty 1

## 2020-11-04 MED ORDER — INSULIN ASPART 100 UNIT/ML ~~LOC~~ SOLN
6.0000 [IU] | Freq: Once | SUBCUTANEOUS | Status: AC
  Administered 2020-11-04: 6 [IU] via SUBCUTANEOUS

## 2020-11-04 MED ORDER — INSULIN ASPART 100 UNIT/ML ~~LOC~~ SOLN
0.0000 [IU] | Freq: Three times a day (TID) | SUBCUTANEOUS | Status: DC
  Administered 2020-11-04: 9 [IU] via SUBCUTANEOUS
  Administered 2020-11-05: 7 [IU] via SUBCUTANEOUS
  Administered 2020-11-05 (×2): 2 [IU] via SUBCUTANEOUS
  Administered 2020-11-06: 1 [IU] via SUBCUTANEOUS

## 2020-11-04 MED ORDER — INSULIN ASPART 100 UNIT/ML ~~LOC~~ SOLN
0.0000 [IU] | Freq: Every day | SUBCUTANEOUS | Status: DC
  Administered 2020-11-04: 5 [IU] via SUBCUTANEOUS
  Administered 2020-11-05: 3 [IU] via SUBCUTANEOUS

## 2020-11-04 MED ORDER — INSULIN ASPART 100 UNIT/ML ~~LOC~~ SOLN: 15.0000 [IU] | Freq: Once | SUBCUTANEOUS | Status: DC

## 2020-11-04 MED ORDER — INSULIN ASPART 100 UNIT/ML ~~LOC~~ SOLN: 9.0000 [IU] | Freq: Once | SUBCUTANEOUS | Status: DC

## 2020-11-04 NOTE — Progress Notes (Signed)
PROGRESS NOTE                                                                                                                                                                                                             Patient Demographics:    Rachael Powell, is a 70 y.o. female, DOB - 29-Sep-1950, WUJ:[id number]  Outpatient Primary MD for the patient is [name], MD   Admit date - 11/02/2020   LOS - 1  No chief complaint on file.      Brief Narrative: Patient is a 70 y.o. female with PMHx of GERD, hypothyroidism-presented with shortness of breath-found to have acute hypoxic respiratory failure due to COVID-19 pneumonia.   COVID-19 vaccinated status: Unvaccinated  Significant Events: 2/24>> Admit to [facility] for shortness of breath-found to have hypoxia due to COVID-19  Significant studies: 2/24>>Chest x-ray: No PE-groundglass opacities bilaterally  COVID-19 medications: Steroids: 2/24>> Remdesivir: 2/24>>  Antibiotics: None  Microbiology data: 2/24>> blood culture: No growth  Procedures: None  Consults: None  DVT prophylaxis: enoxaparin (LOVENOX) injection 40 mg Start: 11/02/20 2100    Subjective:   Patient in bed, appears comfortable, denies any headache, no fever, no chest pain or pressure, no shortness of breath , no abdominal pain. No focal weakness.    Assessment  & Plan :   Acute Hypoxic Resp Failure due to Covid 19 Viral pneumonia: Mild hypoxemia-attempt to titrate off oxygen today-continue steroid/Remdesivir.  No signs of volume overload-does not require diuretics.  Fever: afebrileO2 requirements:  SpO2: 93 % O2 Flow Rate (L/min): 1 L/min   COVID-19 Labs: Recent Labs    11/02/20 1813 11/03/20 0228 11/04/20 0117  DDIMER 3.61* 3.43* 3.29*  FERRITIN 382*  --   --   LDH 319*  --   --   CRP 11.9* 10.5* 5.8*    No results found for: BNP  Recent Labs  Lab 11/02/20 1813   PROCALCITON <0.10    Lab Results  Component Value Date   SARSCOV2NAA POSITIVE (A) 11/02/2020    Prone/Incentive Spirometry: encouraged  incentive spirometry use 3-4/hour.  Elevated D-dimer: Due to COVID-19 related inflammation-refusing to take prophylactic Lovenox due to pain.  I and the previous MD have clearly explained the rationale for prophylactic anticoagulation-risks of life-threatening VTE-stroke, death or disability, patient understands that but refuses to take any shots or  pills for blood thinning, assumes all responsibility.  Dehydration/hyponatremia: Improved-saline lock IV fluids.  GERD: Continue PPI  Hypothyroidism: TSH within normal limits-Per patient no longer on Synthroid due to an unspecified drug reaction.   GI prophylaxis: PPI   Condition - Stable  Family Communication  :  Daughter-[place]-[phone] updated over the phone 2/25, 2/26  Code Status :  Full Code  Diet :   Diet Order            Diet regular Room service appropriate? Yes; Fluid consistency: Thin  Diet effective now                  Disposition Plan  :    Status is: Inpt  The patient will require care spanning > 2 midnights and should be moved to inpatient because: Inpatient level of care appropriate due to severity of illness  Dispo: The patient is from: Home              Anticipated d/c is to: Home              Patient currently is not medically stable to d/c.   Difficult to place patient No   Barriers to discharge: Hypoxia requiring O2 supplementation/complete 5 days of IV Remdesivir  Antimicorbials  :    Anti-infectives (From admission, onward)   Start     Dose/Rate Route Frequency Ordered Stop   11/03/20 1000  remdesivir 100 mg in sodium chloride 0.9 % 100 mL IVPB       "Followed by" Linked Group Details   100 mg 200 mL/hr over 30 Minutes Intravenous Daily 11/02/20 1826 11/07/20 0959   11/02/20 1830  remdesivir 200 mg in sodium chloride 0.9% 250 mL IVPB       "Followed  by" Linked Group Details   200 mg 580 mL/hr over 30 Minutes Intravenous Once 11/02/20 1826 11/02/20 2126      Inpatient Medications  Scheduled Meds: . vitamin C  500 mg Oral Daily  . enoxaparin (LOVENOX) injection  40 mg Subcutaneous Q24H  . feeding supplement  1 Container Oral TID BM  . [START ON 11/05/2020] methylPREDNISolone (SOLU-MEDROL) injection  40 mg Intravenous Daily  . pantoprazole  40 mg Oral Daily  . sodium chloride flush  3 mL Intravenous Q12H  . zinc sulfate  220 mg Oral Daily   Continuous Infusions: . sodium chloride    . remdesivir 100 mg in NS 100 mL 100 mg (11/04/20 0835)   PRN Meds:.sodium chloride, acetaminophen, albuterol, chlorpheniramine-HYDROcodone, [DISCONTINUED] ondansetron **OR** ondansetron (ZOFRAN) IV, polyethylene glycol, sodium chloride, sodium chloride flush   Time Spent in minutes  25  See all Orders from today for further details   Susa Raring M.D on 11/04/2020 at 10:25 AM  To page go to [email or web address] - use universal password  [facility] -  Office  [phone]    Objective:   Vitals:   11/03/20 2215 11/03/20 2355 11/04/20 0315 11/04/20 0523  BP:    109/69  Pulse:    74  Resp:    12  Temp:    97.7 F (36.5 C)  TempSrc:    Oral  SpO2: (!) 88% 94% 94% 93%  Weight:    74.1 kg  Height:        Wt Readings from Last 3 Encounters:  11/04/20 74.1 kg  01/11/20 80.9 kg  12/13/19 77.6 kg     Intake/Output Summary (Last 24 hours) at 11/04/2020 1025 Last data filed at 11/04/2020 0900 Gross per  24 hour  Intake 600 ml  Output --  Net 600 ml     Physical Exam  Awake Alert, No new F.N deficits, Normal affect [place].AT,PERRAL Supple Neck,No JVD, No cervical lymphadenopathy appriciated.  Symmetrical Chest wall movement, Good air movement bilaterally, CTAB RRR,No Gallops, Rubs or new Murmurs, No Parasternal Heave +ve B.Sounds, Abd Soft, No tenderness, No organomegaly appriciated, No rebound - guarding or rigidity. No Cyanosis,  Clubbing or edema, No new Rash or bruise    Data Review:    CBC Recent Labs  Lab 11/02/20 1813 11/03/20 0228 11/04/20 0117  WBC 5.8 4.1 7.0  HGB 13.6 12.4 12.8  HCT 43.0 36.9 38.0  PLT 280 281 373  MCV 87.0 83.9 83.3  MCH 27.5 28.2 28.1  MCHC 31.6 33.6 33.7  RDW 12.9 12.8 12.8  LYMPHSABS 1.4 1.0 1.1  MONOABS 0.3 0.3 0.7  EOSABS 0.0 0.0 0.0  BASOSABS 0.0 0.0 0.0    Chemistries  Recent Labs  Lab 11/02/20 1813 11/03/20 0228 11/04/20 0117  NA 134* 135 138  K 3.6 3.7 3.6  CL 97* 102 103  CO2 23 24 24   GLUCOSE 113* 258* 207*  BUN 16 17 22   CREATININE 1.09* 0.85 0.96  CALCIUM 9.1 8.5* 8.6*  MG  --  2.1 2.1  AST 41 35 35  ALT 43 35 41  ALKPHOS 52 42 48  BILITOT 0.8 0.8 0.6   ------------------------------------------------------------------------------------------------------------------ Recent Labs    11/02/20 1813  TRIG 99    Lab Results  Component Value Date   HGBA1C 6.3 07/27/2013   ------------------------------------------------------------------------------------------------------------------ Recent Labs    11/02/20 1813  TSH 3.850   ------------------------------------------------------------------------------------------------------------------ Recent Labs    11/02/20 1813  FERRITIN 382*    Coagulation profile No results for input(s): INR, PROTIME in the last 168 hours.  Recent Labs    11/03/20 0228 11/04/20 0117  DDIMER 3.43* 3.29*    Cardiac Enzymes No results for input(s): CKMB, TROPONINI, MYOGLOBIN in the last 168 hours.  Invalid input(s): CK ------------------------------------------------------------------------------------------------------------------ No results found for: BNP  Micro Results Recent Results (from the past 240 hour(s))  Resp Panel by RT-PCR (Flu A&B, Covid) Nasopharyngeal Swab     Status: Abnormal   Collection Time: 11/02/20 12:57 PM   Specimen: Nasopharyngeal Swab; Nasopharyngeal(NP) swabs in vial  transport medium  Result Value Ref Range Status   SARS Coronavirus 2 by RT PCR POSITIVE (A) NEGATIVE Final    Comment: RESULT CALLED TO, READ BACK BY AND VERIFIED WITH02/28/22 RN 1433 11/02/20 A BROWNING (NOTE) SARS-CoV-2 target nucleic acids are DETECTED.  The SARS-CoV-2 RNA is generally detectable in upper respiratory specimens during the acute phase of infection. Positive results are indicative of the presence of the identified virus, but do not rule out bacterial infection or co-infection with other pathogens not detected by the test. Clinical correlation with patient history and other diagnostic information is necessary to determine patient infection status. The expected result is Negative.  Fact Sheet for Patients: Dallie Piles  Fact Sheet for Healthcare Providers: 11/04/20  This test is not yet approved or cleared by the [employer] FDA and  has been authorized for detection and/or diagnosis of SARS-CoV-2 by FDA under an Emergency Use Authorization (EUA).  This EUA will remain in effect (meaning this test can b e used) for the duration of  the COVID-19 declaration under Section 564(b)(1) of the Act, 21 U.S.C. section 360bbb-3(b)(1), unless the authorization is terminated or revoked sooner.     Influenza A  by PCR NEGATIVE NEGATIVE Final   Influenza B by PCR NEGATIVE NEGATIVE Final    Comment: (NOTE) The Xpert Xpress SARS-CoV-2/FLU/RSV plus assay is intended as an aid in the diagnosis of influenza from Nasopharyngeal swab specimens and should not be used as a sole basis for treatment. Nasal washings and aspirates are unacceptable for Xpert Xpress SARS-CoV-2/FLU/RSV testing.  Fact Sheet for Patients: BloggerCourse.comhttps://www.fda.gov/media/152166/download  Fact Sheet for Healthcare Providers: SeriousBroker.ithttps://www.fda.gov/media/152162/download  This test is not yet approved or cleared by the [place] FDA and has been  authorized for detection and/or diagnosis of SARS-CoV-2 by FDA under an Emergency Use Authorization (EUA). This EUA will remain in effect (meaning this test can be used) for the duration of the COVID-19 declaration under Section 564(b)(1) of the Act, 21 U.S.C. section 360bbb-3(b)(1), unless the authorization is terminated or revoked.  Performed at [facility], [address] [place], [place] [address]   Blood Culture (routine x 2)     Status: None (Preliminary result)   Collection Time: 11/02/20  6:13 PM   Specimen: BLOOD  Result Value Ref Range Status   Specimen Description BLOOD LEFT ANTECUBITAL  Final   Special Requests   Final    BOTTLES DRAWN AEROBIC AND ANAEROBIC Blood Culture adequate volume   Culture   Final    NO GROWTH < 24 HOURS Performed at [facility], [address] [place], [place] [address]    Report Status PENDING  Incomplete  Blood Culture (routine x 2)     Status: None (Preliminary result)   Collection Time: 11/02/20  6:25 PM   Specimen: BLOOD LEFT HAND  Result Value Ref Range Status   Specimen Description BLOOD LEFT HAND  Final   Special Requests   Final    BOTTLES DRAWN AEROBIC AND ANAEROBIC Blood Culture adequate volume   Culture   Final    NO GROWTH < 24 HOURS Performed at [facility], [address] [place], [place] [address]    Report Status PENDING  Incomplete    Radiology Reports DG Chest 2 View  Result Date: 11/02/2020 CLINICAL DATA:  70 year old female with cough and fatigue for 10 days. Abnormal pulmonary auscultation. EXAM: CHEST - 2 VIEW COMPARISON:  Chest radiographs 11/10/2017 and earlier. FINDINGS: Mediastinal contours remain normal. Stable lung volumes since 2019 with evidence of chronic right lung base scarring and mild architectural distortion. Improved ventilation of the right costophrenic angle since 2019. Coarse bilateral pulmonary opacity elsewhere appears stable. Visualized tracheal air column is within normal  limits. No pneumothorax, pleural effusion or acute pulmonary opacity. No acute osseous abnormality identified. Negative visible bowel gas pattern. IMPRESSION: Chronic lung disease not significantly changed from 2019 radiographs. No acute cardiopulmonary abnormality identified. Electronically Signed   By: Odessa FlemingH  Hall M.D.   On: 11/02/2020 11:32   CT Angio Chest PE W and/or Wo Contrast  Result Date: 11/02/2020 CLINICAL DATA:  Possible COVID and decreased oxygen saturation. Positive D-dimer. EXAM: CT ANGIOGRAPHY CHEST WITH CONTRAST TECHNIQUE: Multidetector CT imaging of the chest was performed using the standard protocol during bolus administration of intravenous contrast. Multiplanar CT image reconstructions and MIPs were obtained to evaluate the vascular anatomy. CONTRAST:  55mL OMNIPAQUE IOHEXOL 350 MG/ML SOLN COMPARISON:  11/02/2020 chest radiograph. No prior CT. FINDINGS: Cardiovascular: The quality of this exam for evaluation of pulmonary embolism is good. No evidence of pulmonary embolism. Normal aortic caliber. Normal heart size, without pericardial effusion. Suggestion of left ventricular hypertrophy. Mediastinum/Nodes: Mild right hilar  adenopathy at 1.5 cm. Left infrahilar node measures 1.0 cm. Tiny hiatal hernia. Lungs/Pleura: No pleural fluid. Mild peripheral and slightly basilar predominant airspace and ground-glass opacity. Upper Abdomen: Normal imaged portions of the liver, spleen, pancreas, adrenal glands, kidneys. Musculoskeletal: No acute osseous abnormality. Review of the MIP images confirms the above findings. IMPRESSION: 1. No evidence of pulmonary embolism. 2. Peripheral and slightly basilar predominant airspace and ground-glass opacity, most consistent with COVID-19 pneumonia. 3. Mild thoracic adenopathy, likely reactive. 4. Tiny hiatal hernia. 5. Suggestion of left ventricular hypertrophy. Electronically Signed   By: Jeronimo Greaves M.D.   On: 11/02/2020 20:57

## 2020-11-04 NOTE — Evaluation (Signed)
Physical Therapy Evaluation Patient Details Name: Rachael Powell MRN: [id number] DOB: Aug 23, 1951 Today's Date: 11/04/2020   History of Present Illness  70 year old female PMH of CKD, GERD, and thyroid disease. Admitted for covid+.  Clinical Impression  Pt admitted with above diagnosis. PTA pt lived alone, active and independent. On eval, she required min guard assist transfers and ambulation 200' without AD. Mildly unsteady but no physical assist to maintain balance. Pt currently with functional limitations due to the deficits listed below (see PT Problem List). Pt will benefit from skilled PT to increase their independence and safety with mobility to allow discharge home. PT to follow acutely. No follow up services indicated.  SATURATION QUALIFICATIONS: (This note is used to comply with regulatory documentation for home oxygen)  Patient Saturations on Room Air at Rest = 94%  Patient Saturations on Room Air while Ambulating = 87%  Patient Saturations on 2 Liters of oxygen while Ambulating = 92%  Please briefly explain why patient needs home oxygen: Supplemental O2 required during mobility to maintain SpO2 > 90%.     Follow Up Recommendations No PT follow up    Equipment Recommendations  None recommended by PT (possible home O2)    Recommendations for Other Services       Precautions / Restrictions Precautions Precautions: Fall      Mobility  Bed Mobility Overal bed mobility: Modified Independent                  Transfers Overall transfer level: Needs assistance Equipment used: None Transfers: Sit to/from Stand;Stand Pivot Transfers Sit to Stand: Min guard Stand pivot transfers: Min guard       General transfer comment: min guard for safety  Ambulation/Gait Ambulation/Gait assistance: Min guard Gait Distance (Feet): 200 Feet Assistive device: None Gait Pattern/deviations: Step-through pattern;Decreased stride length Gait velocity: decreased    General Gait Details: mildly unsteady, no physical assist needed, pt declining cane or RW  Stairs            Wheelchair Mobility    Modified Rankin (Stroke Patients Only)       Balance Overall balance assessment: Needs assistance Sitting-balance support: No upper extremity supported;Feet supported Sitting balance-Leahy Scale: Good     Standing balance support: No upper extremity supported;During functional activity Standing balance-Leahy Scale: Fair Standing balance comment: mildly unsteady but pt declining cane or RW                             Pertinent Vitals/Pain Pain Assessment: No/denies pain    Home Living Family/patient expects to be discharged to:: Private residence Living Arrangements: Alone Available Help at Discharge: Family;Available PRN/intermittently Type of Home: House Home Access: Stairs to enter Entrance Stairs-Rails: None Entrance Stairs-Number of Steps: 2 Home Layout: One level Home Equipment: None      Prior Function Level of Independence: Independent         Comments: Pt is very active and independent. Recently retired.     Hand Dominance   Dominant Hand: Right    Extremity/Trunk Assessment   Upper Extremity Assessment Upper Extremity Assessment: Overall WFL for tasks assessed    Lower Extremity Assessment Lower Extremity Assessment: Overall WFL for tasks assessed    Cervical / Trunk Assessment Cervical / Trunk Assessment: Normal  Communication   Communication: No difficulties  Cognition Arousal/Alertness: Awake/alert Behavior During Therapy: WFL for tasks assessed/performed Overall Cognitive Status: Within Functional Limits for tasks assessed  General Comments General comments (skin integrity, edema, etc.): Mobilized on RA and 2L. SpO2 87% on RA during amb and 92% with 2L.    Exercises     Assessment/Plan    PT Assessment Patient needs continued  PT services  PT Problem List Decreased mobility;Decreased activity tolerance;Cardiopulmonary status limiting activity;Decreased balance       PT Treatment Interventions Therapeutic activities;Gait training;Therapeutic exercise;Patient/family education;Balance training;Functional mobility training    PT Goals (Current goals can be found in the Care Plan section)  Acute Rehab PT Goals Patient Stated Goal: home PT Goal Formulation: With patient Time For Goal Achievement: 11/18/20 Potential to Achieve Goals: Good    Frequency Min 3X/week   Barriers to discharge        Co-evaluation               AM-PAC PT "6 Clicks" Mobility  Outcome Measure Help needed turning from your back to your side while in a flat bed without using bedrails?: None Help needed moving from lying on your back to sitting on the side of a flat bed without using bedrails?: None Help needed moving to and from a bed to a chair (including a wheelchair)?: A Little Help needed standing up from a chair using your arms (e.g., wheelchair or bedside chair)?: A Little Help needed to walk in hospital room?: A Little Help needed climbing 3-5 steps with a railing? : A Little 6 Click Score: 20    End of Session Equipment Utilized During Treatment: Gait belt;Oxygen Activity Tolerance: Patient tolerated treatment well Patient left: in chair;with call bell/phone within reach Nurse Communication: Mobility status PT Visit Diagnosis: Unsteadiness on feet (R26.81)    Time: 1410-1437 PT Time Calculation (min) (ACUTE ONLY): 27 min   Charges:   PT Evaluation $PT Eval Moderate Complexity: 1 Mod PT Treatments $Gait Training: 8-22 mins        Aida Raider, PT  Office # [phone] Pager [phone]   Ilda Foil 11/04/2020, 3:38 PM

## 2020-11-04 NOTE — Progress Notes (Signed)
SATURATION QUALIFICATIONS: (This note is used to comply with regulatory documentation for home oxygen)  Patient Saturations on Room Air at Rest = 91%  Patient Saturations on Room Air while Ambulating = 86%  Patient Saturations on 2 Liters of oxygen while Ambulating = 90%  Please briefly explain why patient needs home oxygen: 

## 2020-11-05 DIAGNOSIS — U071 COVID-19: Secondary | ICD-10-CM | POA: Diagnosis not present

## 2020-11-05 LAB — CBC WITH DIFFERENTIAL/PLATELET
Abs Immature Granulocytes: 0 10*3/uL (ref 0.00–0.07)
Basophils Absolute: 0 10*3/uL (ref 0.0–0.1)
Basophils Relative: 0 %
Eosinophils Absolute: 0 10*3/uL (ref 0.0–0.5)
Eosinophils Relative: 0 %
HCT: 35.4 % — ABNORMAL LOW (ref 36.0–46.0)
Hemoglobin: 12 g/dL (ref 12.0–15.0)
Lymphocytes Relative: 24 %
Lymphs Abs: 1.8 10*3/uL (ref 0.7–4.0)
MCH: 28.4 pg (ref 26.0–34.0)
MCHC: 33.9 g/dL (ref 30.0–36.0)
MCV: 83.9 fL (ref 80.0–100.0)
Monocytes Absolute: 0.8 10*3/uL (ref 0.1–1.0)
Monocytes Relative: 11 %
Neutro Abs: 4.9 10*3/uL (ref 1.7–7.7)
Neutrophils Relative %: 65 %
Platelets: 363 10*3/uL (ref 150–400)
RBC: 4.22 MIL/uL (ref 3.87–5.11)
RDW: 13 % (ref 11.5–15.5)
WBC: 7.6 10*3/uL (ref 4.0–10.5)
nRBC: 0 % (ref 0.0–0.2)
nRBC: 1 /100 WBC — ABNORMAL HIGH

## 2020-11-05 LAB — MAGNESIUM: Magnesium: 2.2 mg/dL (ref 1.7–2.4)

## 2020-11-05 LAB — COMPREHENSIVE METABOLIC PANEL
ALT: 32 U/L (ref 0–44)
AST: 20 U/L (ref 15–41)
Albumin: 2.6 g/dL — ABNORMAL LOW (ref 3.5–5.0)
Alkaline Phosphatase: 52 U/L (ref 38–126)
Anion gap: 9 (ref 5–15)
BUN: 17 mg/dL (ref 8–23)
CO2: 24 mmol/L (ref 22–32)
Calcium: 8.3 mg/dL — ABNORMAL LOW (ref 8.9–10.3)
Chloride: 105 mmol/L (ref 98–111)
Creatinine, Ser: 0.79 mg/dL (ref 0.44–1.00)
GFR, Estimated: >60 mL/min (ref >60)
Glucose, Bld: 207 mg/dL — ABNORMAL HIGH (ref 70–99)
Potassium: 3.1 mmol/L — ABNORMAL LOW (ref 3.5–5.1)
Sodium: 138 mmol/L (ref 135–145)
Total Bilirubin: 0.7 mg/dL (ref 0.3–1.2)
Total Protein: 6.1 g/dL — ABNORMAL LOW (ref 6.5–8.1)

## 2020-11-05 LAB — GLUCOSE, CAPILLARY
Glucose-Capillary: 155 mg/dL — ABNORMAL HIGH (ref 70–99)
Glucose-Capillary: 184 mg/dL — ABNORMAL HIGH (ref 70–99)
Glucose-Capillary: 341 mg/dL — ABNORMAL HIGH (ref 70–99)
Glucose-Capillary: 260 mg/dL — ABNORMAL HIGH (ref 70–99)

## 2020-11-05 LAB — C-REACTIVE PROTEIN: CRP: 1.7 mg/dL — ABNORMAL HIGH (ref <1.0)

## 2020-11-05 MED ORDER — POTASSIUM CHLORIDE CRYS ER 20 MEQ PO TBCR
40.0000 meq | EXTENDED_RELEASE_TABLET | Freq: Once | ORAL | Status: AC
  Administered 2020-11-05: 40 meq via ORAL
  Filled 2020-11-05: qty 2

## 2020-11-05 NOTE — Progress Notes (Signed)
SATURATION QUALIFICATIONS: (This note is used to comply with regulatory documentation for home oxygen)  Patient Saturations on Room Air at Rest = 96%  Patient Saturations on Room Air while Ambulating = 88%  Patient Saturations on 2 Liters of oxygen while Ambulating = 92%  Please briefly explain why patient needs home oxygen: 

## 2020-11-05 NOTE — TOC Initial Note (Signed)
Transition of Care [facility]) - Initial/Assessment Note    Patient Details  Name: Rachael Powell MRN: [id number] Date of Birth: 10/05/1950  [facility] CM/SW Contact:    Lawerance Sabal, RN Phone Number: 11/05/2020, 12:12 PM  Clinical Narrative:                Spoke w patient on the phone. Discussed home oxygen need. She states that she has been told she needed to be on oxygen before at the hospital and she has refused it in the past. She was reluctantly agreeable to have home oxygen set up repeating that she wasn't going to be on it long. Referral accepted by [employer] who will bring O2 to unit for her to take home. She states that she has family who will provide transportation at [place].    Expected Discharge Plan: Home/Self Care Barriers to Discharge: Continued Medical Work up   Patient Goals and CMS Choice Patient states their goals for this hospitalization and ongoing recovery are:: to go home      Expected Discharge Plan and Services Expected Discharge Plan: Home/Self Care                         DME Arranged: Oxygen DME Agency: [employer] Date DME Agency Contacted: 11/05/20 Time DME Agency Contacted: 1212 Representative spoke with at DME Agency: Arnold Long            Prior Living Arrangements/Services                       Activities of Daily Living Home Assistive Devices/Equipment: Eyeglasses ADL Screening (condition at time of admission) Patient's cognitive ability adequate to safely complete daily activities?: Yes Is the patient deaf or have difficulty hearing?: No Does the patient have difficulty seeing, even when wearing glasses/contacts?: Yes Does the patient have difficulty concentrating, remembering, or making decisions?: No Patient able to express need for assistance with ADLs?: Yes Does the patient have difficulty dressing or bathing?: No Independently performs ADLs?: Yes (appropriate for developmental age) Does the patient have difficulty  walking or climbing stairs?: No Weakness of Legs: Both Weakness of Arms/Hands: None  Permission Sought/Granted                  Emotional Assessment              Admission diagnosis:  Acute hypoxemic respiratory failure due to COVID-19 (HCC) [U07.1, J96.01] COVID-19 virus infection [U07.1] Pneumonia due to COVID-19 virus [U07.1, J12.82] Patient Active Problem List   Diagnosis Date Noted  . Pneumonia due to COVID-19 virus 11/03/2020  . COVID-19 virus infection 11/02/2020  . Lactic acidosis 11/02/2020  . Dehydration with hyponatremia 11/02/2020  . 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 Hypothyroidism   . GERD without esophagitis    PCP:  [name], MD Pharmacy:   [facility] [address] [place]), [place] - [address] [place]) [place] [address] Phone: [phone] Fax: [phone]     Social Determinants of Health (SDOH) Interventions    Readmission Risk Interventions No flowsheet data found.

## 2020-11-05 NOTE — Evaluation (Signed)
Occupational Therapy Evaluation Patient Details Name: Rachael Powell MRN: [id number] DOB: [birth year] Today's Date: 11/05/2020    History of Present Illness 70 year old female PMH of CKD, GERD, and thyroid disease. Admitted for covid+.   Clinical Impression   Pt PTA: Pt living alone and independent with ADL, mobility and driving. Pt currently, modified independent after set-upA for ADL. Pt education and discussion provided for energy conservation and assistance that may be require upon d/c home (groceries, medication refills). Pt reports that she has assistance from family/friends for that. Pt reports "I like being independent and I plan to be at home." Pt education complete and pt resumed eating lunch. Pt's O2 off in room and O2 >90% on RA. No further OT skilled services required. OT signing off. Thank you.    Follow Up Recommendations  No OT follow up    Equipment Recommendations  None recommended by OT    Recommendations for Other Services       Precautions / Restrictions Precautions Precautions: Fall Restrictions Weight Bearing Restrictions: No      Mobility Bed Mobility Overal bed mobility: Modified Independent                  Transfers                 General transfer comment: wanting to stay in recliner due to lunch arrival    Balance Overall balance assessment: No apparent balance deficits (not formally assessed)                                         ADL either performed or assessed with clinical judgement   ADL Overall ADL's : Modified independent                                       General ADL Comments: Pt reports performing own ADL with no physical assist and mobility in room with O2 and no AD. Proof of bath with materials in room.     Vision Baseline Vision/History: No visual deficits Patient Visual Report: No change from baseline Vision Assessment?: No apparent visual deficits     Perception      Praxis      Pertinent Vitals/Pain Pain Assessment: No/denies pain     Hand Dominance Right   Extremity/Trunk Assessment Upper Extremity Assessment Upper Extremity Assessment: Overall WFL for tasks assessed   Lower Extremity Assessment Lower Extremity Assessment: Overall WFL for tasks assessed   Cervical / Trunk Assessment Cervical / Trunk Assessment: Normal   Communication Communication Communication: No difficulties   Cognition Arousal/Alertness: Awake/alert Behavior During Therapy: WFL for tasks assessed/performed Overall Cognitive Status: Within Functional Limits for tasks assessed                                 General Comments: Pt slightly irritated about requiring O2 and stating that she has already performed all ADL and mobility prior to her lunch arriving and wants to eat now.   General Comments  Education and discussion provided for energy conservation and assistance that may be require upon d/c home (groceries, medication refills). Pt reports that she has assistance from family/friends for that. Pt reports "I like being independent and I plan to be at home."  Pt education complete and pt resumed eating food. O2 off in room and O2 >90% on RA.    Exercises     Shoulder Instructions      Home Living Family/patient expects to be discharged to:: Private residence Living Arrangements: Alone Available Help at Discharge: Family;Available PRN/intermittently Type of Home: House Home Access: Stairs to enter [employer] of Steps: 2 Entrance Stairs-Rails: None Home Layout: One level     Bathroom Shower/Tub: Chief Strategy Officer: Standard     Home Equipment: None          Prior Functioning/Environment Level of Independence: Independent        Comments: Pt is very active and independent. Recently retired.        OT Problem List: Decreased activity tolerance      OT Treatment/Interventions:      OT  Goals(Current goals can be found in the care plan section) Acute Rehab OT Goals Patient Stated Goal: home OT Goal Formulation: All assessment and education complete, DC therapy  OT Frequency:     Barriers to D/C:            Co-evaluation              AM-PAC OT "6 Clicks" Daily Activity     Outcome Measure Help from another person eating meals?: None Help from another person taking care of personal grooming?: None Help from another person toileting, which includes using toliet, bedpan, or urinal?: None Help from another person bathing (including washing, rinsing, drying)?: None Help from another person to put on and taking off regular upper body clothing?: None Help from another person to put on and taking off regular lower body clothing?: None 6 Click Score: 24   End of Session Nurse Communication: Mobility status  Activity Tolerance: Patient tolerated treatment well Patient left: in chair;with call bell/phone within reach  OT Visit Diagnosis: Unsteadiness on feet (R26.81)                Time: 9485-4627 OT Time Calculation (min): 10 min Charges:  OT General Charges $OT Visit: 1 Visit OT Evaluation $OT Eval Low Complexity: 1 Low  Flora Lipps, OTR/L Acute Rehabilitation Services Pager: [phone] Office: [phone]   Beryl Balz C 11/05/2020, 2:13 PM

## 2020-11-05 NOTE — Progress Notes (Signed)
PROGRESS NOTE                                                                                                                                                                                                             Patient Demographics:    Rachael Powell, is a 70 y.o. female, DOB - 04-06-1951, ZDG:[id number]  Outpatient Primary MD for the patient is [name], MD   Admit date - 11/02/2020   LOS - 2  No chief complaint on file.      Brief Narrative: Patient is a 70 y.o. female with PMHx of GERD, hypothyroidism-presented with shortness of breath-found to have acute hypoxic respiratory failure due to COVID-19 pneumonia.   COVID-19 vaccinated status: Unvaccinated  Significant Events: 2/24>> Admit to [facility] for shortness of breath-found to have hypoxia due to COVID-19  Significant studies: 2/24>>Chest x-ray: No PE-groundglass opacities bilaterally  COVID-19 medications: Steroids: 2/24>> Remdesivir: 2/24>>  Antibiotics: None  Microbiology data: 2/24>> blood culture: No growth  Procedures: None  Consults: None  DVT prophylaxis: enoxaparin (LOVENOX) injection 40 mg Start: 11/02/20 2100 -note patient has refused all DVT prophylaxis as she does not believe in blood thinners, SCDs have been ordered.  She accepts all the risk she knows that she can have a life-threatening blood clot. SCDs added.    Subjective:   Patient in bed, appears comfortable, denies any headache, no fever, no chest pain or pressure, improved shortness of breath , no abdominal pain. No focal weakness.   Assessment  & Plan :   Acute Hypoxic Resp Failure due to Covid 19 Viral pneumonia: she is not vaccinated and seems to have incurred moderate parenchymal lung injury, being treated with steroids and Remdesivir, at rest she is stable on room air to 1 L oxygen however she does require oxygen upon ambulation, continue monitoring closely,  advance activity and reduce oxygen requirements.  Likely discharge in the next 1 to 2 days if improvement continues.  Fever: afebrileO2 requirements:  SpO2: 92 % O2 Flow Rate (L/min): 1 L/min   COVID-19 Labs: Recent Labs    11/02/20 1813 11/03/20 0228 11/04/20 0117 11/05/20 0342  DDIMER 3.61* 3.43* 3.29*  --   FERRITIN 382*  --   --   --   LDH 319*  --   --   --   CRP 11.9* 10.5*  5.8* 1.7*    No results found for: BNP  Recent Labs  Lab 11/02/20 1813  PROCALCITON <0.10    Lab Results  Component Value Date   SARSCOV2NAA POSITIVE (A) 11/02/2020    Prone/Incentive Spirometry: encouraged  incentive spirometry use 3-4/hour.  Elevated D-dimer: Due to COVID-19 related inflammation-refusing to take prophylactic Lovenox due to pain.  I and the previous MD have clearly explained the rationale for prophylactic anticoagulation-risks of life-threatening VTE-stroke, death or disability, patient understands that but refuses to take any shots or pills for blood thinning, assumes all responsibility.  Dehydration/hyponatremia: Improved-saline lock IV fluids.  GERD: Continue PPI  Hypothyroidism: TSH within normal limits-Per patient no longer on Synthroid due to an unspecified drug reaction.  Hypokalemia.  Replaced.    GI prophylaxis: PPI   Condition - Stable  Family Communication  :  Daughter-[phone] updated over the phone 2/25, 2/26  Code Status :  Full Code  Diet :   Diet Order            Diet regular Room service appropriate? Yes; Fluid consistency: Thin  Diet effective now                  Disposition Plan  :    Status is: Inpt  The patient will require care spanning > 2 midnights and should be moved to inpatient because: Inpatient level of care appropriate due to severity of illness  Dispo: The patient is from: Home              Anticipated d/c is to: Home              Patient currently is not medically stable to d/c.   Difficult to place patient  No   Barriers to discharge: Hypoxia requiring O2 supplementation/complete 5 days of IV Remdesivir  Antimicorbials  :    Anti-infectives (From admission, onward)   Start     Dose/Rate Route Frequency Ordered Stop   11/03/20 1000  remdesivir 100 mg in sodium chloride 0.9 % 100 mL IVPB       "Followed by" Linked Group Details   100 mg 200 mL/hr over 30 Minutes Intravenous Daily 11/02/20 1826 11/07/20 0959   11/02/20 1830  remdesivir 200 mg in sodium chloride 0.9% 250 mL IVPB       "Followed by" Linked Group Details   200 mg 580 mL/hr over 30 Minutes Intravenous Once 11/02/20 1826 11/02/20 2126      Inpatient Medications  Scheduled Meds: . vitamin C  500 mg Oral Daily  . enoxaparin (LOVENOX) injection  40 mg Subcutaneous Q24H  . feeding supplement  1 Container Oral TID BM  . insulin aspart  0-5 Units Subcutaneous QHS  . insulin aspart  0-9 Units Subcutaneous TID WC  . methylPREDNISolone (SOLU-MEDROL) injection  40 mg Intravenous Daily  . pantoprazole  40 mg Oral Daily  . zinc sulfate  220 mg Oral Daily   Continuous Infusions: . remdesivir 100 mg in NS 100 mL 100 mg (11/05/20 0935)   PRN Meds:.acetaminophen, albuterol, chlorpheniramine-HYDROcodone, [DISCONTINUED] ondansetron **OR** ondansetron (ZOFRAN) IV, polyethylene glycol, sodium chloride   Time Spent in minutes  25  See all Orders from today for further details   Susa RaringPrashant Breta Demedeiros M.D on 11/05/2020 at 9:54 AM  To page go to [email or web address] - use universal password  [facility] -  Office  [phone]    Objective:   Vitals:   11/04/20 0523 11/04/20 1453 11/04/20 2019 11/05/20 0441  BP: 109/69  132/71 112/64 123/72  Pulse: 74 73 68 68  Resp: 12 19 (!) 21 (!) 22  Temp: 97.7 F (36.5 C) 98.2 F (36.8 C) 97.8 F (36.6 C) 98.2 F (36.8 C)  TempSrc: Oral Oral Oral Oral  SpO2: 93% 94% 94% 92%  Weight: 74.1 kg     Height:        Wt Readings from Last 3 Encounters:  11/04/20 74.1 kg  01/11/20 80.9 kg   12/13/19 77.6 kg     Intake/Output Summary (Last 24 hours) at 11/05/2020 0954 Last data filed at 11/05/2020 0935 Gross per 24 hour  Intake 550 ml  Output --  Net 550 ml     Physical Exam  Awake Alert, No new F.N deficits, Normal affect [place].AT,PERRAL Supple Neck,No JVD, No cervical lymphadenopathy appriciated.  Symmetrical Chest wall movement, Good air movement bilaterally, CTAB RRR,No Gallops, Rubs or new Murmurs, No Parasternal Heave +ve B.Sounds, Abd Soft, No tenderness, No organomegaly appriciated, No rebound - guarding or rigidity. No Cyanosis, Clubbing or edema, No new Rash or bruise     Data Review:    CBC Recent Labs  Lab 11/02/20 1813 11/03/20 0228 11/04/20 0117 11/05/20 0342  WBC 5.8 4.1 7.0 7.6  HGB 13.6 12.4 12.8 12.0  HCT 43.0 36.9 38.0 35.4*  PLT 280 281 373 363  MCV 87.0 83.9 83.3 83.9  MCH 27.5 28.2 28.1 28.4  MCHC 31.6 33.6 33.7 33.9  RDW 12.9 12.8 12.8 13.0  LYMPHSABS 1.4 1.0 1.1 1.8  MONOABS 0.3 0.3 0.7 0.8  EOSABS 0.0 0.0 0.0 0.0  BASOSABS 0.0 0.0 0.0 0.0    Chemistries  Recent Labs  Lab 11/02/20 1813 11/03/20 0228 11/04/20 0117 11/05/20 0342  NA 134* 135 138 138  K 3.6 3.7 3.6 3.1*  CL 97* 102 103 105  CO2 23 24 24 24   GLUCOSE 113* 258* 207* 207*  BUN 16 17 22 17   CREATININE 1.09* 0.85 0.96 0.79  CALCIUM 9.1 8.5* 8.6* 8.3*  MG  --  2.1 2.1 2.2  AST 41 35 35 20  ALT 43 35 41 32  ALKPHOS 52 42 48 52  BILITOT 0.8 0.8 0.6 0.7   ------------------------------------------------------------------------------------------------------------------ Recent Labs    11/02/20 1813  TRIG 99    Lab Results  Component Value Date   HGBA1C 6.6 (H) 11/04/2020   ------------------------------------------------------------------------------------------------------------------ Recent Labs    11/02/20 1813  TSH 3.850    ------------------------------------------------------------------------------------------------------------------ Recent Labs    11/02/20 1813  FERRITIN 382*    Coagulation profile No results for input(s): INR, PROTIME in the last 168 hours.  Recent Labs    11/03/20 0228 11/04/20 0117  DDIMER 3.43* 3.29*    Cardiac Enzymes No results for input(s): CKMB, TROPONINI, MYOGLOBIN in the last 168 hours.  Invalid input(s): CK ------------------------------------------------------------------------------------------------------------------ No results found for: BNP  Micro Results Recent Results (from the past 240 hour(s))  Resp Panel by RT-PCR (Flu A&B, Covid) Nasopharyngeal Swab     Status: Abnormal   Collection Time: 11/02/20 12:57 PM   Specimen: Nasopharyngeal Swab; Nasopharyngeal(NP) swabs in vial transport medium  Result Value Ref Range Status   SARS Coronavirus 2 by RT PCR POSITIVE (A) NEGATIVE Final    Comment: RESULT CALLED TO, READ BACK BY AND VERIFIED WITH02/28/22 RN 1433 11/02/20 A BROWNING (NOTE) SARS-CoV-2 target nucleic acids are DETECTED.  The SARS-CoV-2 RNA is generally detectable in upper respiratory specimens during the acute phase of infection. Positive results are indicative of the presence of the  identified virus, but do not rule out bacterial infection or co-infection with other pathogens not detected by the test. Clinical correlation with patient history and other diagnostic information is necessary to determine patient infection status. The expected result is Negative.  Fact Sheet for Patients: BloggerCourse.com  Fact Sheet for Healthcare Providers: SeriousBroker.it  This test is not yet approved or cleared by the [place] FDA and  has been authorized for detection and/or diagnosis of SARS-CoV-2 by FDA under an Emergency Use Authorization (EUA).  This EUA will remain in effect (meaning this  test can b e used) for the duration of  the COVID-19 declaration under Section 564(b)(1) of the Act, 21 U.S.C. section 360bbb-3(b)(1), unless the authorization is terminated or revoked sooner.     Influenza A by PCR NEGATIVE NEGATIVE Final   Influenza B by PCR NEGATIVE NEGATIVE Final    Comment: (NOTE) The Xpert Xpress SARS-CoV-2/FLU/RSV plus assay is intended as an aid in the diagnosis of influenza from Nasopharyngeal swab specimens and should not be used as a sole basis for treatment. Nasal washings and aspirates are unacceptable for Xpert Xpress SARS-CoV-2/FLU/RSV testing.  Fact Sheet for Patients: BloggerCourse.com  Fact Sheet for Healthcare Providers: SeriousBroker.it  This test is not yet approved or cleared by the [place] FDA and has been authorized for detection and/or diagnosis of SARS-CoV-2 by FDA under an Emergency Use Authorization (EUA). This EUA will remain in effect (meaning this test can be used) for the duration of the COVID-19 declaration under Section 564(b)(1) of the Act, 21 U.S.C. section 360bbb-3(b)(1), unless the authorization is terminated or revoked.  Performed at [facility], [address], [place], [place] [address]   Blood Culture (routine x 2)     Status: None (Preliminary result)   Collection Time: 11/02/20  6:13 PM   Specimen: BLOOD  Result Value Ref Range Status   Specimen Description BLOOD LEFT ANTECUBITAL  Final   Special Requests   Final    BOTTLES DRAWN AEROBIC AND ANAEROBIC Blood Culture adequate volume   Culture   Final    NO GROWTH 2 DAYS Performed at [facility], [address] [place], [place] [address]    Report Status PENDING  Incomplete  Blood Culture (routine x 2)     Status: None (Preliminary result)   Collection Time: 11/02/20  6:25 PM   Specimen: BLOOD LEFT HAND  Result Value Ref Range Status   Specimen Description BLOOD LEFT HAND  Final   Special  Requests   Final    BOTTLES DRAWN AEROBIC AND ANAEROBIC Blood Culture adequate volume   Culture   Final    NO GROWTH 2 DAYS Performed at [facility], [address], [place], [place] [address]    Report Status PENDING  Incomplete    Radiology Reports DG Chest 2 View  Result Date: 11/02/2020 CLINICAL DATA:  70 year old female with cough and fatigue for 10 days. Abnormal pulmonary auscultation. EXAM: CHEST - 2 VIEW COMPARISON:  Chest radiographs 11/10/2017 and earlier. FINDINGS: Mediastinal contours remain normal. Stable lung volumes since 2019 with evidence of chronic right lung base scarring and mild architectural distortion. Improved ventilation of the right costophrenic angle since 2019. Coarse bilateral pulmonary opacity elsewhere appears stable. Visualized tracheal air column is within normal limits. No pneumothorax, pleural effusion or acute pulmonary opacity. No acute osseous abnormality identified. Negative visible bowel gas pattern. IMPRESSION: Chronic lung disease not significantly changed from 2019 radiographs. No acute cardiopulmonary abnormality identified.  Electronically Signed   By: Odessa Fleming M.D.   On: 11/02/2020 11:32   CT Angio Chest PE W and/or Wo Contrast  Result Date: 11/02/2020 CLINICAL DATA:  Possible COVID and decreased oxygen saturation. Positive D-dimer. EXAM: CT ANGIOGRAPHY CHEST WITH CONTRAST TECHNIQUE: Multidetector CT imaging of the chest was performed using the standard protocol during bolus administration of intravenous contrast. Multiplanar CT image reconstructions and MIPs were obtained to evaluate the vascular anatomy. CONTRAST:  22mL OMNIPAQUE IOHEXOL 350 MG/ML SOLN COMPARISON:  11/02/2020 chest radiograph. No prior CT. FINDINGS: Cardiovascular: The quality of this exam for evaluation of pulmonary embolism is good. No evidence of pulmonary embolism. Normal aortic caliber. Normal heart size, without pericardial effusion. Suggestion of left ventricular  hypertrophy. Mediastinum/Nodes: Mild right hilar adenopathy at 1.5 cm. Left infrahilar node measures 1.0 cm. Tiny hiatal hernia. Lungs/Pleura: No pleural fluid. Mild peripheral and slightly basilar predominant airspace and ground-glass opacity. Upper Abdomen: Normal imaged portions of the liver, spleen, pancreas, adrenal glands, kidneys. Musculoskeletal: No acute osseous abnormality. Review of the MIP images confirms the above findings. IMPRESSION: 1. No evidence of pulmonary embolism. 2. Peripheral and slightly basilar predominant airspace and ground-glass opacity, most consistent with COVID-19 pneumonia. 3. Mild thoracic adenopathy, likely reactive. 4. Tiny hiatal hernia. 5. Suggestion of left ventricular hypertrophy. Electronically Signed   By: Jeronimo Greaves M.D.   On: 11/02/2020 20:57

## 2020-11-06 ENCOUNTER — Inpatient Hospital Stay (HOSPITAL_COMMUNITY): Payer: Medicare HMO

## 2020-11-06 DIAGNOSIS — U071 COVID-19: Secondary | ICD-10-CM | POA: Diagnosis not present

## 2020-11-06 LAB — COMPREHENSIVE METABOLIC PANEL
ALT: 32 U/L (ref 0–44)
AST: 22 U/L (ref 15–41)
Albumin: 2.6 g/dL — ABNORMAL LOW (ref 3.5–5.0)
Alkaline Phosphatase: 52 U/L (ref 38–126)
Anion gap: 11 (ref 5–15)
BUN: 16 mg/dL (ref 8–23)
CO2: 24 mmol/L (ref 22–32)
Calcium: 8.5 mg/dL — ABNORMAL LOW (ref 8.9–10.3)
Chloride: 104 mmol/L (ref 98–111)
Creatinine, Ser: 0.84 mg/dL (ref 0.44–1.00)
GFR, Estimated: >60 mL/min (ref >60)
Glucose, Bld: 112 mg/dL — ABNORMAL HIGH (ref 70–99)
Potassium: 4 mmol/L (ref 3.5–5.1)
Sodium: 139 mmol/L (ref 135–145)
Total Bilirubin: 0.7 mg/dL (ref 0.3–1.2)
Total Protein: 6.1 g/dL — ABNORMAL LOW (ref 6.5–8.1)

## 2020-11-06 LAB — CBC WITH DIFFERENTIAL/PLATELET
Abs Immature Granulocytes: 0.07 10*3/uL (ref 0.00–0.07)
Basophils Absolute: 0 10*3/uL (ref 0.0–0.1)
Basophils Relative: 0 %
Eosinophils Absolute: 0 10*3/uL (ref 0.0–0.5)
Eosinophils Relative: 0 %
HCT: 38 % (ref 36.0–46.0)
Hemoglobin: 12 g/dL (ref 12.0–15.0)
Immature Granulocytes: 1 %
Lymphocytes Relative: 29 %
Lymphs Abs: 2.3 10*3/uL (ref 0.7–4.0)
MCH: 27.5 pg (ref 26.0–34.0)
MCHC: 31.6 g/dL (ref 30.0–36.0)
MCV: 87 fL (ref 80.0–100.0)
Monocytes Absolute: 0.9 10*3/uL (ref 0.1–1.0)
Monocytes Relative: 11 %
Neutro Abs: 4.7 10*3/uL (ref 1.7–7.7)
Neutrophils Relative %: 59 %
Platelets: 374 10*3/uL (ref 150–400)
RBC: 4.37 MIL/uL (ref 3.87–5.11)
RDW: 13.1 % (ref 11.5–15.5)
WBC: 8 10*3/uL (ref 4.0–10.5)
nRBC: 0 % (ref 0.0–0.2)

## 2020-11-06 LAB — MAGNESIUM: Magnesium: 2 mg/dL (ref 1.7–2.4)

## 2020-11-06 LAB — GLUCOSE, CAPILLARY: Glucose-Capillary: 127 mg/dL — ABNORMAL HIGH (ref 70–99)

## 2020-11-06 LAB — C-REACTIVE PROTEIN: CRP: 2.4 mg/dL — ABNORMAL HIGH (ref <1.0)

## 2020-11-06 MED ORDER — ALBUTEROL SULFATE HFA 108 (90 BASE) MCG/ACT IN AERS: 2.0000 | INHALATION_SPRAY | RESPIRATORY_TRACT | 0 refills | Status: AC | PRN

## 2020-11-06 MED ORDER — METHYLPREDNISOLONE 4 MG PO TBPK: ORAL_TABLET | ORAL | 0 refills | Status: AC

## 2020-11-06 NOTE — Discharge Instructions (Signed)
Follow with Primary MD [name], MD in 7 days   Get CBC, CMP, 2 view Chest X ray -  checked next visit within 1 week by Primary MD   Activity: As tolerated with Full fall precautions use walker/cane & assistance as needed  Disposition Home   Diet: Heart Healthy    Special Instructions: If you have smoked or chewed Tobacco  in the last 2 yrs please stop smoking, stop any regular Alcohol  and or any Recreational drug use.  On your next visit with your primary care physician please Get Medicines reviewed and adjusted.  Please request your Prim.MD to go over all Hospital Tests and Procedure/Radiological results at the follow up, please get all Hospital records sent to your Prim MD by signing hospital release before you go home.  If you experience worsening of your admission symptoms, develop shortness of breath, life threatening emergency, suicidal or homicidal thoughts you must seek medical attention immediately by calling 911 or calling your MD immediately  if symptoms less severe.  You Must read complete instructions/literature along with all the possible adverse reactions/side effects for all the Medicines you take and that have been prescribed to you. Take any new Medicines after you have completely understood and accpet all the possible adverse reactions/side effects.       Person Under Monitoring Name: Rachael Powell  Location: [address] [place] [facility] [address]   Infection Prevention Recommendations for Individuals Confirmed to have, or Being Evaluated for, 2019 Novel Coronavirus (COVID-19) Infection Who Receive Care at Home  Individuals who are confirmed to have, or are being evaluated for, COVID-19 should follow the prevention steps below until a healthcare provider or local or state health department says they can return to normal activities.  Stay home except to get medical care You should restrict activities outside your home, except for getting medical  care. Do not go to work, school, or public areas, and do not use public transportation or taxis.  Call ahead before visiting your doctor Before your medical appointment, call the healthcare provider and tell them that you have, or are being evaluated for, COVID-19 infection. This will help the healthcare provider's office take steps to keep other people from getting infected. Ask your healthcare provider to call the local or state health department.  Monitor your symptoms Seek prompt medical attention if your illness is worsening (e.g., difficulty breathing). Before going to your medical appointment, call the healthcare provider and tell them that you have, or are being evaluated for, COVID-19 infection. Ask your healthcare provider to call the local or state health department.  Wear a facemask You should wear a facemask that covers your nose and mouth when you are in the same room with other people and when you visit a healthcare provider. People who live with or visit you should also wear a facemask while they are in the same room with you.  Separate yourself from other people in your home As much as possible, you should stay in a different room from other people in your home. Also, you should use a separate bathroom, if available.  Avoid sharing household items You should not share dishes, drinking glasses, cups, eating utensils, towels, bedding, or other items with other people in your home. After using these items, you should wash them thoroughly with soap and water.  Cover your coughs and sneezes Cover your mouth and nose with a tissue when you cough or sneeze, or you can cough or sneeze  into your sleeve. Throw used tissues in a lined trash can, and immediately wash your hands with soap and water for at least 20 seconds or use an alcohol-based hand rub.  Wash your [facility] your hands often and thoroughly with soap and water for at least 20 seconds. You can use an alcohol-based  hand sanitizer if soap and water are not available and if your hands are not visibly dirty. Avoid touching your eyes, nose, and mouth with unwashed hands.   Prevention Steps for Caregivers and Household Members of Individuals Confirmed to have, or Being Evaluated for, COVID-19 Infection Being Cared for in the Home  If you live with, or provide care at home for, a person confirmed to have, or being evaluated for, COVID-19 infection please follow these guidelines to prevent infection:  Follow healthcare provider's instructions Make sure that you understand and can help the patient follow any healthcare provider instructions for all care.  Provide for the patient's basic needs You should help the patient with basic needs in the home and provide support for getting groceries, prescriptions, and other personal needs.  Monitor the patient's symptoms If they are getting sicker, call his or her medical provider and tell them that the patient has, or is being evaluated for, COVID-19 infection. This will help the healthcare provider's office take steps to keep other people from getting infected. Ask the healthcare provider to call the local or state health department.  Limit the number of people who have contact with the patient  If possible, have only one caregiver for the patient.  Other household members should stay in another home or place of residence. If this is not possible, they should stay  in another room, or be separated from the patient as much as possible. Use a separate bathroom, if available.  Restrict visitors who do not have an essential need to be in the home.  Keep older adults, very young children, and other sick people away from the patient Keep older adults, very young children, and those who have compromised immune systems or chronic health conditions away from the patient. This includes people with chronic heart, lung, or kidney conditions, diabetes, and  cancer.  Ensure good ventilation Make sure that shared spaces in the home have good air flow, such as from an air conditioner or an opened window, weather permitting.  Wash your hands often  Wash your hands often and thoroughly with soap and water for at least 20 seconds. You can use an alcohol based hand sanitizer if soap and water are not available and if your hands are not visibly dirty.  Avoid touching your eyes, nose, and mouth with unwashed hands.  Use disposable paper towels to dry your hands. If not available, use dedicated cloth towels and replace them when they become wet.  Wear a facemask and gloves  Wear a disposable facemask at all times in the room and gloves when you touch or have contact with the patient's blood, body fluids, and/or secretions or excretions, such as sweat, saliva, sputum, nasal mucus, vomit, urine, or feces.  Ensure the mask fits over your nose and mouth tightly, and do not touch it during use.  Throw out disposable facemasks and gloves after using them. Do not reuse.  Wash your hands immediately after removing your facemask and gloves.  If your personal clothing becomes contaminated, carefully remove clothing and launder. Wash your hands after handling contaminated clothing.  Place all used disposable facemasks, gloves, and other waste in  a lined container before disposing them with other household waste.  Remove gloves and wash your hands immediately after handling these items.  Do not share dishes, glasses, or other household items with the patient  Avoid sharing household items. You should not share dishes, drinking glasses, cups, eating utensils, towels, bedding, or other items with a patient who is confirmed to have, or being evaluated for, COVID-19 infection.  After the person uses these items, you should wash them thoroughly with soap and water.  Wash laundry thoroughly  Immediately remove and wash clothes or bedding that have blood, body  fluids, and/or secretions or excretions, such as sweat, saliva, sputum, nasal mucus, vomit, urine, or feces, on them.  Wear gloves when handling laundry from the patient.  Read and follow directions on labels of laundry or clothing items and detergent. In general, wash and dry with the warmest temperatures recommended on the label.  Clean all areas the individual has used often  Clean all touchable surfaces, such as counters, tabletops, doorknobs, bathroom fixtures, toilets, phones, keyboards, tablets, and bedside tables, every day. Also, clean any surfaces that may have blood, body fluids, and/or secretions or excretions on them.  Wear gloves when cleaning surfaces the patient has come in contact with.  Use a diluted bleach solution (e.g., dilute bleach with 1 part bleach and 10 parts water) or a household disinfectant with a label that says EPA-registered for coronaviruses. To make a bleach solution at home, add 1 tablespoon of bleach to 1 quart (4 cups) of water. For a larger supply, add  cup of bleach to 1 gallon (16 cups) of water.  Read labels of cleaning products and follow recommendations provided on product labels. Labels contain instructions for safe and effective use of the cleaning product including precautions you should take when applying the product, such as wearing gloves or eye protection and making sure you have good ventilation during use of the product.  Remove gloves and wash hands immediately after cleaning.  Monitor yourself for signs and symptoms of illness Caregivers and household members are considered close contacts, should monitor their health, and will be asked to limit movement outside of the home to the extent possible. Follow the monitoring steps for close contacts listed on the symptom monitoring form.   ? If you have additional questions, contact your local health department or call the epidemiologist on call at [phone] (available 24/7). ? This  guidance is subject to change. For the most up-to-date guidance from [facility], please refer to their website: [email or web address]

## 2020-11-06 NOTE — Discharge Summary (Signed)
Rachael Powell OAC:[id number] DOB: 1951/08/14 DOA: 11/02/2020  PCP: [name], MD  Admit date: 11/02/2020  Discharge date: 11/06/2020  Admitted From: Home   Disposition:  Home   Recommendations for Outpatient Follow-up:   Follow up with PCP in 1-2 weeks  PCP Please obtain BMP/CBC, 2 view CXR in 1week,  (see Discharge instructions)   PCP Please follow up on the following pending results: Kindly check CBC, CMP, 2 view chest x-ray and TSH in 1 to 2 weeks.   Home Health: None   Equipment/Devices: Home o2 if she qualifies  Consultations: None  Discharge Condition: Stable    CODE STATUS: Full    Diet Recommendation: Heart Healthy   CC - SOB   Brief history of present illness from the day of admission and additional interim summary    Patient is a 70 y.o. female with PMHx of GERD, hypothyroidism-presented with shortness of breath-found to have acute hypoxic respiratory failure due to COVID-19 pneumonia.   COVID-19 vaccinated status: Unvaccinated  Significant Events: 2/24>> Admit to [facility] for shortness of breath-found to have hypoxia due to COVID-19  Significant studies: 2/24>>Chest x-ray: No PE-groundglass opacities bilaterally  COVID-19 medications: Steroids: 2/24>> Remdesivir: 2/24>>  Antibiotics: None  Microbiology data: 2/24>> blood culture: No growth                                                                 Hospital Course    Acute Hypoxic Resp Failure due to Covid 19 Viral pneumonia: she is not vaccinated and seems to have incurred moderate parenchymal lung injury, she was treated with combination of steroids and Remdesivir, now symptom-free at rest on room air, she has finished her treatment course, will ambulate her again today if she qualifies for home oxygen as needed  than it will be ordered.  Follow with PCP in a week.  Currently back to baseline at rest on room air.   Elevated D-dimer: Due to COVID-19 related inflammation-refusing to take prophylactic Lovenox due to pain.  I and the previous MD have clearly explained the rationale for prophylactic anticoagulation-risks of life-threatening VTE-stroke, death or disability, patient understands that but refuses to take any shots or pills for blood thinning, assumes all responsibility.  Dehydration/hyponatremia: Solved after hydration with IV fluids.  GERD: Continue PPI  Hypothyroidism: TSH within normal limits-Per patient no longer on Synthroid due to an unspecified drug reaction.  Hypokalemia.  Replaced.   Discharge diagnosis     Principal Problem:   COVID-19 virus infection Active Problems:   Hypothyroidism   GERD without esophagitis   Lactic acidosis   Dehydration with hyponatremia   Pneumonia due to COVID-19 virus    Discharge instructions    Discharge Instructions    Diet - low sodium heart healthy   Complete by: As  directed    Discharge instructions   Complete by: As directed    Follow with Primary MD [name], MD in 7 days   Get CBC, CMP, 2 view Chest X ray -  checked next visit within 1 week by Primary MD   Activity: As tolerated with Full fall precautions use walker/cane & assistance as needed  Disposition Home   Diet: Heart Healthy    Special Instructions: If you have smoked or chewed Tobacco  in the last 2 yrs please stop smoking, stop any regular Alcohol  and or any Recreational drug use.  On your next visit with your primary care physician please Get Medicines reviewed and adjusted.  Please request your Prim.MD to go over all Hospital Tests and Procedure/Radiological results at the follow up, please get all Hospital records sent to your Prim MD by signing hospital release before you go home.  If you experience worsening of your admission symptoms, develop  shortness of breath, life threatening emergency, suicidal or homicidal thoughts you must seek medical attention immediately by calling 911 or calling your MD immediately  if symptoms less severe.  You Must read complete instructions/literature along with all the possible adverse reactions/side effects for all the Medicines you take and that have been prescribed to you. Take any new Medicines after you have completely understood and accpet all the possible adverse reactions/side effects.   Increase activity slowly   Complete by: As directed    MyChart COVID-19 home monitoring program   Complete by: Nov 06, 2020    Is the patient willing to use the MyChart Mobile App for home monitoring?: Yes   Temperature monitoring   Complete by: Nov 06, 2020    After how many days would you like to receive a notification of this patient's flowsheet entries?: 1      Discharge Medications   Allergies as of 11/06/2020   No Known Allergies     Medication List    TAKE these medications   acetaminophen 650 MG CR tablet Commonly known as: Tylenol 8 Hour Arthritis Pain Take 1 tablet (650 mg total) by mouth every 8 (eight) hours as needed for pain.   albuterol 108 (90 Base) MCG/ACT inhaler Commonly known as: VENTOLIN HFA Inhale 2 puffs into the lungs every 4 (four) hours as needed for wheezing or shortness of breath.   ECHINACEA C COMPLETE PO Take 1 tablet by mouth daily.   Fish Oil 1000 MG Caps Take 1,000 mg by mouth daily.   methylPREDNISolone 4 MG Tbpk tablet Commonly known as: MEDROL DOSEPAK follow package directions   Sambucus Elderberry 50 MG/5ML Syrp Generic drug: Black Elderberry Take 5 mLs by mouth daily.   vitamin C 1000 MG tablet Take 1,000 mg by mouth daily.            Durable Medical Equipment  (From admission, onward)         Start     Ordered   11/05/20 0953  For home use only DME oxygen  Once       Question Answer Comment  Length of Need 6 Months   Mode or (Route)  Nasal cannula   Liters per Minute 2   Frequency Continuous (stationary and portable oxygen unit needed)   Oxygen conserving device Yes   Oxygen delivery system Gas      11/05/20 0952           Follow-up Information    [facility] Follow up.  Why: Home oxygen supplier Contact information: [address] [place] [place] [address] [phone]        Doristine BosworthStallings, Zoe A, MD. Schedule an appointment as soon as possible for a visit in 1 week(s).   Specialty: Internal Medicine Contact information: [address] [place] [id number] [phone]               Major procedures and Radiology Reports - PLEASE review detailed and final reports thoroughly  -       DG Chest 2 View  Result Date: 11/02/2020 CLINICAL DATA:  70 year old female with cough and fatigue for 10 days. Abnormal pulmonary auscultation. EXAM: CHEST - 2 VIEW COMPARISON:  Chest radiographs 11/10/2017 and earlier. FINDINGS: Mediastinal contours remain normal. Stable lung volumes since 2019 with evidence of chronic right lung base scarring and mild architectural distortion. Improved ventilation of the right costophrenic angle since 2019. Coarse bilateral pulmonary opacity elsewhere appears stable. Visualized tracheal air column is within normal limits. No pneumothorax, pleural effusion or acute pulmonary opacity. No acute osseous abnormality identified. Negative visible bowel gas pattern. IMPRESSION: Chronic lung disease not significantly changed from 2019 radiographs. No acute cardiopulmonary abnormality identified. Electronically Signed   By: Odessa FlemingH  Hall M.D.   On: 11/02/2020 11:32   CT Angio Chest PE W and/or Wo Contrast  Result Date: 11/02/2020 CLINICAL DATA:  Possible COVID and decreased oxygen saturation. Positive D-dimer. EXAM: CT ANGIOGRAPHY CHEST WITH CONTRAST TECHNIQUE: Multidetector CT imaging of the chest was performed using the standard protocol during bolus administration of intravenous  contrast. Multiplanar CT image reconstructions and MIPs were obtained to evaluate the vascular anatomy. CONTRAST:  55mL OMNIPAQUE IOHEXOL 350 MG/ML SOLN COMPARISON:  11/02/2020 chest radiograph. No prior CT. FINDINGS: Cardiovascular: The quality of this exam for evaluation of pulmonary embolism is good. No evidence of pulmonary embolism. Normal aortic caliber. Normal heart size, without pericardial effusion. Suggestion of left ventricular hypertrophy. Mediastinum/Nodes: Mild right hilar adenopathy at 1.5 cm. Left infrahilar node measures 1.0 cm. Tiny hiatal hernia. Lungs/Pleura: No pleural fluid. Mild peripheral and slightly basilar predominant airspace and ground-glass opacity. Upper Abdomen: Normal imaged portions of the liver, spleen, pancreas, adrenal glands, kidneys. Musculoskeletal: No acute osseous abnormality. Review of the MIP images confirms the above findings. IMPRESSION: 1. No evidence of pulmonary embolism. 2. Peripheral and slightly basilar predominant airspace and ground-glass opacity, most consistent with COVID-19 pneumonia. 3. Mild thoracic adenopathy, likely reactive. 4. Tiny hiatal hernia. 5. Suggestion of left ventricular hypertrophy. Electronically Signed   By: Jeronimo GreavesKyle  Talbot M.D.   On: 11/02/2020 20:57   DG Chest Port 1 View  Result Date: 11/06/2020 CLINICAL DATA:  Shortness of breath.  Covid-19 viral infection. EXAM: PORTABLE CHEST 1 VIEW COMPARISON:  11/02/2020 FINDINGS: The heart size and mediastinal contours are within normal limits. Previously seen mild pulmonary opacity predominantly in the peripheral and lower lung zones has nearly completely resolved since previous study. No new or worsening areas of pulmonary opacity are seen. No evidence of pleural effusion. The visualized skeletal structures are unremarkable. IMPRESSION: Near complete resolution of mild bilateral peripheral pulmonary opacities since prior exam. No acute findings. Electronically Signed   By: Danae OrleansJohn A Stahl M.D.   On:  11/06/2020 08:51    Micro Results     Recent Results (from the past 240 hour(s))  Resp Panel by RT-PCR (Flu A&B, Covid) Nasopharyngeal Swab     Status: Abnormal   Collection Time: 11/02/20 12:57 PM   Specimen: Nasopharyngeal Swab; Nasopharyngeal(NP) swabs in vial transport medium  Result Value Ref Range Status   SARS Coronavirus 2 by RT PCR POSITIVE (A) NEGATIVE Final    Comment: RESULT CALLED TO, READ BACK BY AND VERIFIED WITHDallie Piles RN [id number] 11/02/20 A BROWNING (NOTE) SARS-CoV-2 target nucleic acids are DETECTED.  The SARS-CoV-2 RNA is generally detectable in upper respiratory specimens during the acute phase of infection. Positive results are indicative of the presence of the identified virus, but do not rule out bacterial infection or co-infection with other pathogens not detected by the test. Clinical correlation with patient history and other diagnostic information is necessary to determine patient infection status. The expected result is Negative.  Fact Sheet for Patients: BloggerCourse.com  Fact Sheet for Healthcare Providers: SeriousBroker.it  This test is not yet approved or cleared by the [place] FDA and  has been authorized for detection and/or diagnosis of SARS-CoV-2 by FDA under an Emergency Use Authorization (EUA).  This EUA will remain in effect (meaning this test can b e used) for the duration of  the COVID-19 declaration under Section 564(b)(1) of the Act, 21 U.S.C. section 360bbb-3(b)(1), unless the authorization is terminated or revoked sooner.     Influenza A by PCR NEGATIVE NEGATIVE Final   Influenza B by PCR NEGATIVE NEGATIVE Final    Comment: (NOTE) The Xpert Xpress SARS-CoV-2/FLU/RSV plus assay is intended as an aid in the diagnosis of influenza from Nasopharyngeal swab specimens and should not be used as a sole basis for treatment. Nasal washings and aspirates are unacceptable for Xpert  Xpress SARS-CoV-2/FLU/RSV testing.  Fact Sheet for Patients: BloggerCourse.com  Fact Sheet for Healthcare Providers: SeriousBroker.it  This test is not yet approved or cleared by the [place] FDA and has been authorized for detection and/or diagnosis of SARS-CoV-2 by FDA under an Emergency Use Authorization (EUA). This EUA will remain in effect (meaning this test can be used) for the duration of the COVID-19 declaration under Section 564(b)(1) of the Act, 21 U.S.C. section 360bbb-3(b)(1), unless the authorization is terminated or revoked.  Performed at [facility], [address] [place], [place] [address]   Blood Culture (routine x 2)     Status: None (Preliminary result)   Collection Time: 11/02/20  6:13 PM   Specimen: BLOOD  Result Value Ref Range Status   Specimen Description BLOOD LEFT ANTECUBITAL  Final   Special Requests   Final    BOTTLES DRAWN AEROBIC AND ANAEROBIC Blood Culture adequate volume   Culture   Final    NO GROWTH 3 DAYS Performed at [facility], [address], [place], [place] [address]    Report Status PENDING  Incomplete  Blood Culture (routine x 2)     Status: None (Preliminary result)   Collection Time: 11/02/20  6:25 PM   Specimen: BLOOD LEFT HAND  Result Value Ref Range Status   Specimen Description BLOOD LEFT HAND  Final   Special Requests   Final    BOTTLES DRAWN AEROBIC AND ANAEROBIC Blood Culture adequate volume   Culture   Final    NO GROWTH 3 DAYS Performed at [facility], [address] [place], [place] [address]    Report Status PENDING  Incomplete    Today   Subjective    Keajah Killough today has no headache,no chest abdominal pain,no new weakness tingling or numbness, feels much better wants to go home today    Objective   Blood pressure 116/77, pulse 80, temperature 98.1 F (36.7 C), temperature source  Oral, resp. rate 19, height 5\' 4"  (1.626 m),  weight 74.1 kg, SpO2 90 %.   Intake/Output Summary (Last 24 hours) at 11/06/2020 0941 Last data filed at 11/06/2020 0453 Gross per 24 hour  Intake 270 ml  Output --  Net 270 ml    Exam  Awake Alert, No new F.N deficits, Normal affect [place].AT,PERRAL Supple Neck,No JVD, No cervical lymphadenopathy appriciated.  Symmetrical Chest wall movement, Good air movement bilaterally, CTAB RRR,No Gallops,Rubs or new Murmurs, No Parasternal Heave +ve B.Sounds, Abd Soft, Non tender, No organomegaly appriciated, No rebound -guarding or rigidity. No Cyanosis, Clubbing or edema, No new Rash or bruise   Data Review   CBC w Diff:  Lab Results  Component Value Date   WBC 8.0 11/06/2020   HGB 12.0 11/06/2020   HGB 13.8 11/21/2018   HCT 38.0 11/06/2020   HCT 41.2 11/21/2018   PLT 374 11/06/2020   PLT 296 11/21/2018   LYMPHOPCT 29 11/06/2020   MONOPCT 11 11/06/2020   EOSPCT 0 11/06/2020   BASOPCT 0 11/06/2020    CMP:  Lab Results  Component Value Date   NA 139 11/06/2020   NA 140 01/13/2019   K 4.0 11/06/2020   CL 104 11/06/2020   CO2 24 11/06/2020   BUN 16 11/06/2020   BUN 16 01/13/2019   CREATININE 0.84 11/06/2020   CREATININE 0.94 01/04/2015   PROT 6.1 (L) 11/06/2020   PROT 7.7 11/21/2018   ALBUMIN 2.6 (L) 11/06/2020   ALBUMIN 4.6 11/21/2018   BILITOT 0.7 11/06/2020   BILITOT 0.5 11/21/2018   ALKPHOS 52 11/06/2020   AST 22 11/06/2020   ALT 32 11/06/2020  .   Total Time in preparing paper work, data evaluation and todays exam - 35 minutes  11/08/2020 M.D on 11/06/2020 at 9:41 AM  [facility]

## 2020-11-06 NOTE — Progress Notes (Signed)
Physical Therapy Treatment Patient Details Name: Rachael Powell MRN: [id number] DOB: April 23, 1951 Today's Date: 11/06/2020    History of Present Illness Pt is a 70 y.o. female admitted 11/02/20 with SOB; workup for acute hypoxic respiratory failure due to COVID-19 PNA. PMH includes hypothyroidism.   PT Comments    Pt progressing well with mobility; demonstrates improved activity tolerance with ambulation today, denies SOB with no DOE noted. Pt preparing for d/c home this afternoon. Reviewed educ; pt has no further questions or concerns. Pt has met short-term acute PT goals; will d/c PT.   Follow Up Recommendations  No PT follow up     Equipment Recommendations  None recommended by PT    Recommendations for Other Services       Precautions / Restrictions Precautions Precautions: None Restrictions Weight Bearing Restrictions: No    Mobility  Bed Mobility               General bed mobility comments: Received seated in recliner    Transfers Overall transfer level: Independent Equipment used: None Transfers: Sit to/from Stand              Ambulation/Gait Ambulation/Gait assistance: Nurse, learning disability (Feet): 350 Feet Assistive device: None;IV Pole Gait Pattern/deviations: Step-through pattern;Decreased stride length Gait velocity: Decreased   General Gait Details: Slow, steady gait indep with and without pushing IV pole; no overt instability or LOB noted   Stairs             Wheelchair Mobility    Modified Rankin (Stroke Patients Only)       Balance Overall balance assessment: No apparent balance deficits (not formally assessed)                                          Cognition Arousal/Alertness: Awake/alert Behavior During Therapy: WFL for tasks assessed/performed;Flat affect Overall Cognitive Status: Within Functional Limits for tasks assessed                                         Exercises      General Comments General comments (skin integrity, edema, etc.): Pt preparing for d/c home today; reviewed educ re: activity recommendations, IS/flutter valve use at home      Pertinent Vitals/Pain Pain Assessment: No/denies pain    Home Living                      Prior Function            PT Goals (current goals can now be found in the care plan section) Progress towards PT goals: Goals met/education completed, patient discharged from PT    Frequency    Min 3X/week      PT Plan Current plan remains appropriate    Co-evaluation              AM-PAC PT "6 Clicks" Mobility   Outcome Measure  Help needed turning from your back to your side while in a flat bed without using bedrails?: None Help needed moving from lying on your back to sitting on the side of a flat bed without using bedrails?: None Help needed moving to and from a bed to a chair (including a wheelchair)?: None Help needed standing up from a chair using your arms (  e.g., wheelchair or bedside chair)?: None Help needed to walk in hospital room?: None Help needed climbing 3-5 steps with a railing? : None 6 Click Score: 24    End of Session   Activity Tolerance: Patient tolerated treatment well Patient left: in chair;with call bell/phone within reach Nurse Communication: Mobility status PT Visit Diagnosis: Unsteadiness on feet (R26.81)     Time: 1016-1030 PT Time Calculation (min) (ACUTE ONLY): 14 min  Charges:  $Therapeutic Exercise: 8-22 mins                     Mabeline Caras, PT, DPT Acute Rehabilitation Services  Pager [phone] Office [place] 11/06/2020, 12:02 PM

## 2020-11-06 NOTE — TOC Transition Note (Signed)
Transition of Care [facility]) - CM/SW Discharge Note   Patient Details  Name: Rachael Powell MRN: [id number] Date of Birth: May 21, 1951  [facility] CM/SW Contact:  Kermit Balo, RN Phone Number: 11/06/2020, 11:01 AM   Clinical Narrative:    Oxygen for transport home has been delivered to the unit. CM spoke to patient via phone and she says they oxygen company reached out to her yesterday about delivery but she was still hospitalized. CM has updated [employer] about d/c home today and they will call pt again. CM has also provided her with the number to [employer].  Pt has transport home.   Final next level of care: Home/Self Care Barriers to Discharge: No Barriers Identified   Patient Goals and CMS Choice Patient states their goals for this hospitalization and ongoing recovery are:: to go home   Choice offered to / list presented to : Patient  Discharge Placement                       Discharge Plan and Services                DME Arranged: Oxygen DME Agency: [facility] Date DME Agency Contacted: 11/06/20 Time DME Agency Contacted: 1212 Representative spoke with at DME Agency: Velna Hatchet            Social Determinants of Health (SDOH) Interventions     Readmission Risk Interventions No flowsheet data found.

## 2020-11-06 NOTE — Progress Notes (Signed)
SATURATION QUALIFICATIONS: (This note is used to comply with regulatory documentation for home oxygen)  Patient Saturations on Room Air at Rest = 93%  Patient Saturations on Room Air while Ambulating = 90%   

## 2020-11-06 NOTE — Progress Notes (Signed)
Patient discharging home. Vital signs stable at time of discharge as reflected in discharge summary. Discharge instructions given and verbal understanding returned. Patient discharging with 2 O2 tanks.

## 2020-11-07 LAB — CULTURE, BLOOD (ROUTINE X 2)
Culture: NO GROWTH
Culture: NO GROWTH
Special Requests: ADEQUATE
Special Requests: ADEQUATE

## 2020-11-16 ENCOUNTER — Ambulatory Visit
Admission: RE | Admit: 2020-11-16 | Discharge: 2020-11-16 | Disposition: A | Discharge status: Home / Self Care | Payer: BC Managed Care – PPO | Source: Ambulatory Visit | Attending: Family | Admitting: Family

## 2020-11-16 ENCOUNTER — Other Ambulatory Visit: Payer: Self-pay

## 2020-11-16 ENCOUNTER — Other Ambulatory Visit: Payer: Self-pay | Admitting: Family

## 2020-11-16 DIAGNOSIS — Z8616 Personal history of COVID-19: Secondary | ICD-10-CM

## 2021-06-11 ENCOUNTER — Other Ambulatory Visit: Payer: Self-pay | Admitting: Family Medicine

## 2021-06-11 DIAGNOSIS — Z1231 Encounter for screening mammogram for malignant neoplasm of breast: Secondary | ICD-10-CM

## 2021-07-05 ENCOUNTER — Ambulatory Visit
Admission: RE | Admit: 2021-07-05 | Discharge: 2021-07-05 | Disposition: A | Discharge status: Home / Self Care | Payer: Medicare HMO | Source: Ambulatory Visit | Attending: Family Medicine | Admitting: Family Medicine

## 2021-07-05 ENCOUNTER — Other Ambulatory Visit: Payer: Self-pay

## 2021-07-05 DIAGNOSIS — Z1231 Encounter for screening mammogram for malignant neoplasm of breast: Secondary | ICD-10-CM

## 2022-01-03 IMAGING — DX DG LUMBAR SPINE COMPLETE 4+V
5 series · 5 of 5 positions shown · non-contrast
Comparison: 10/14/2003

CLINICAL DATA: Recent twisting injury with low back pain, initial
encounter

EXAM:
LUMBAR SPINE - COMPLETE 4+ VIEW

[l-spine ap]
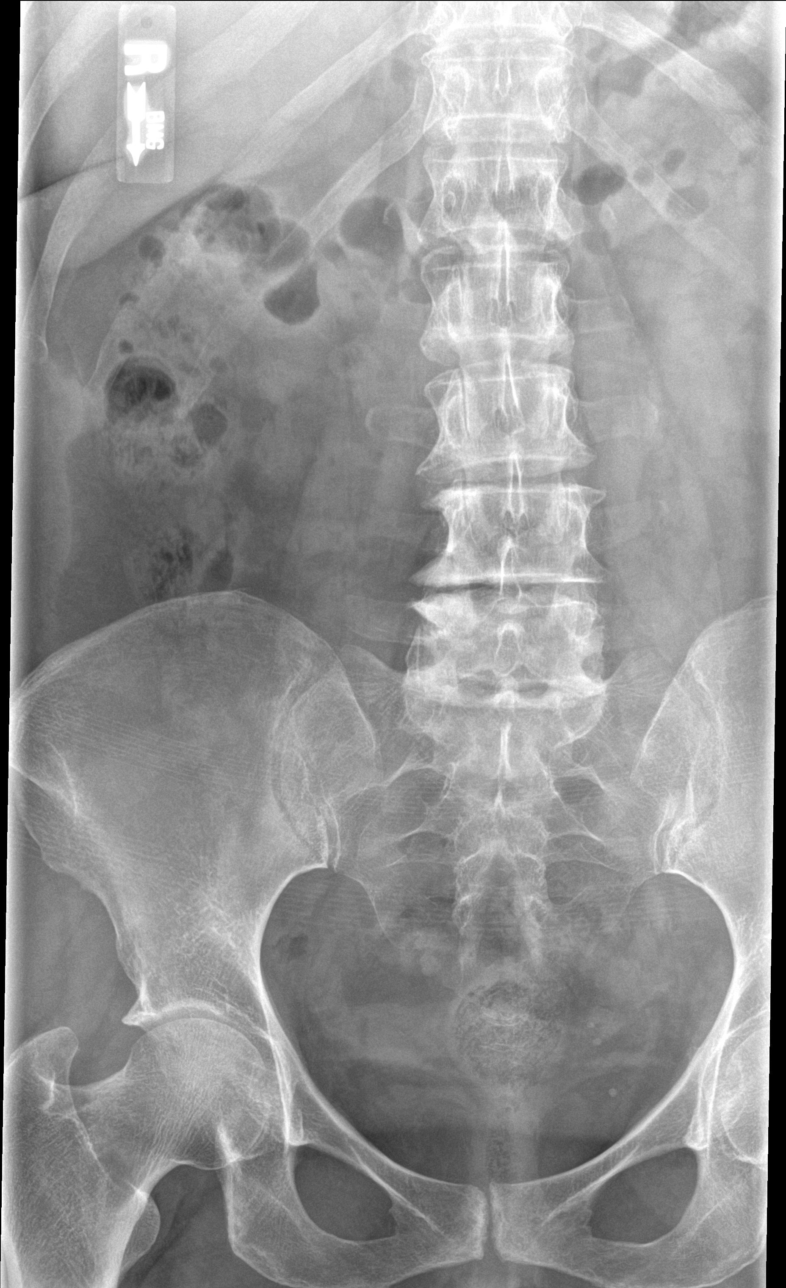

[l-spine obl (1 of 2)]
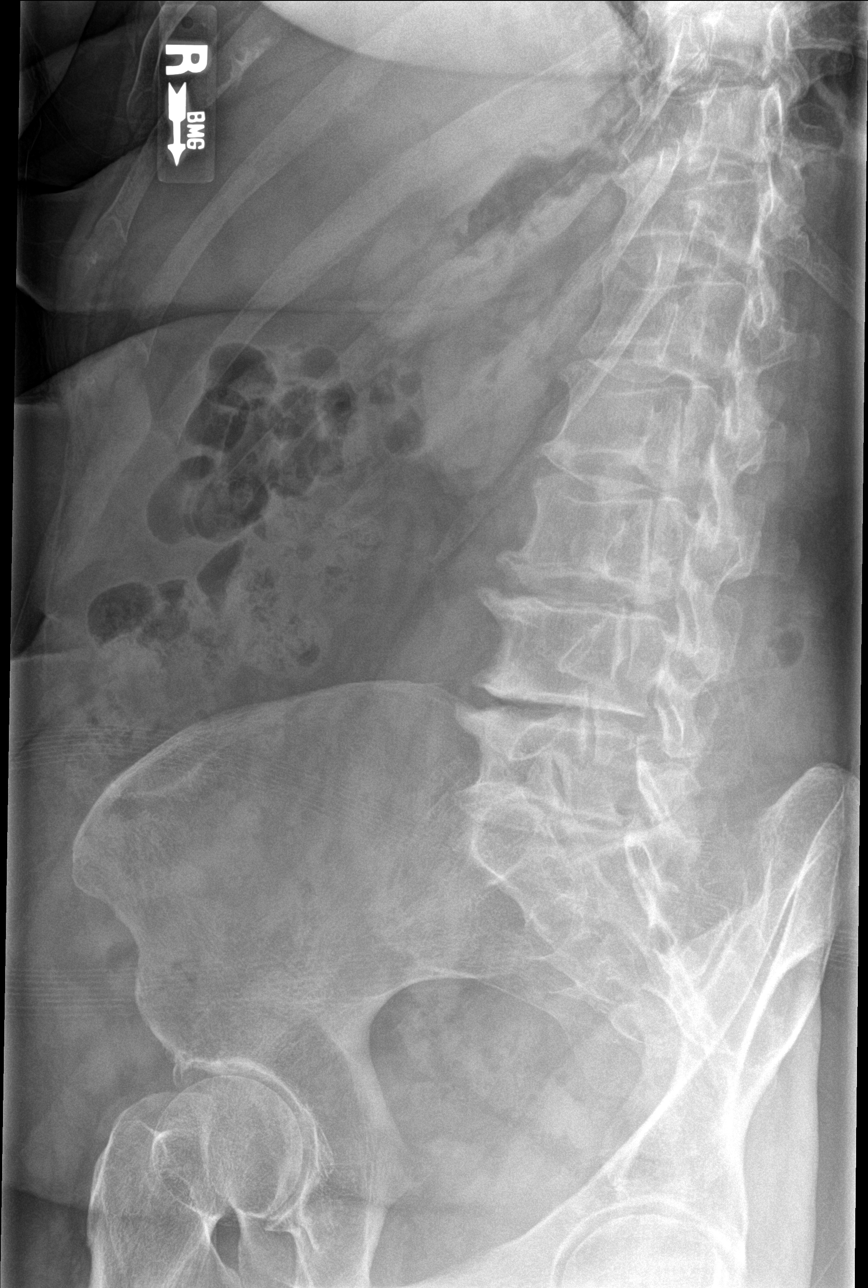

[l-spine obl (2 of 2)]
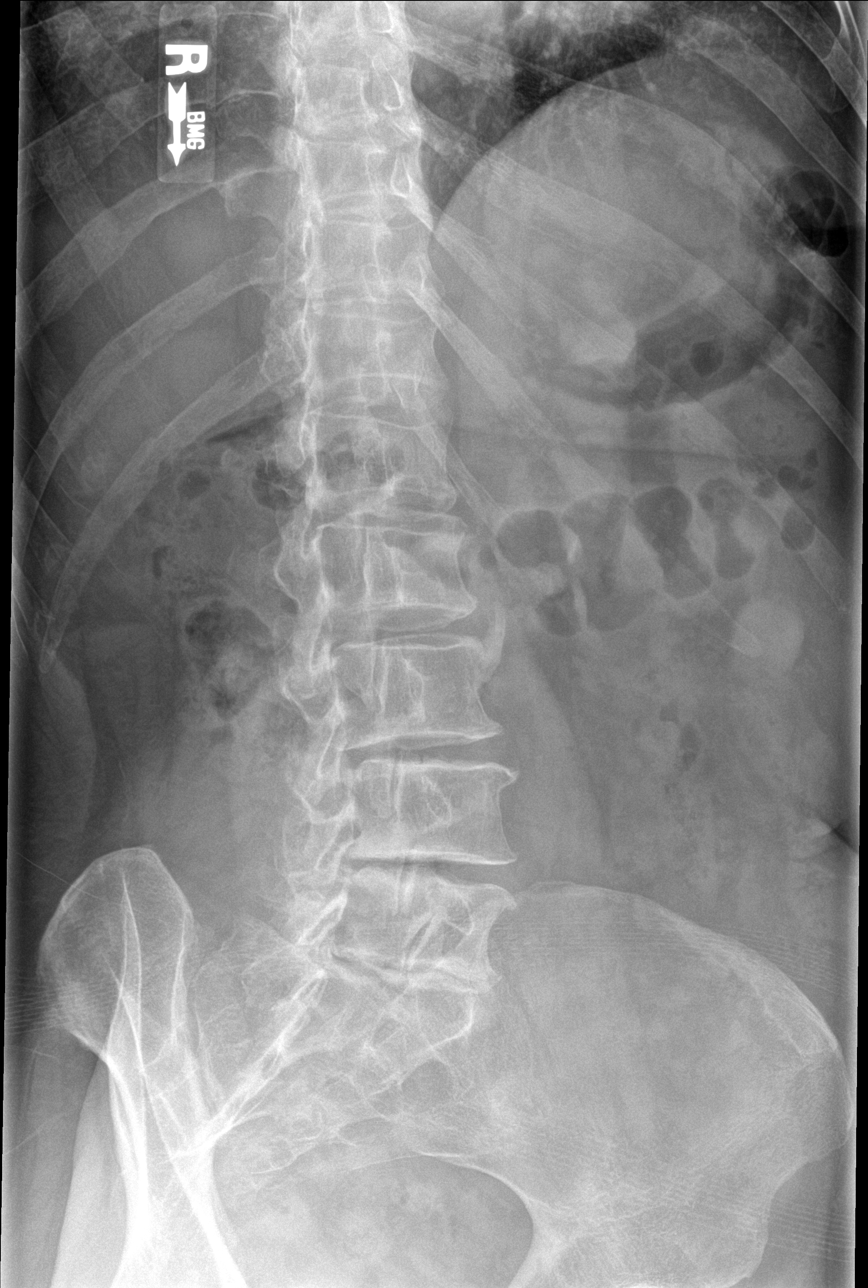

[l-spine lat]
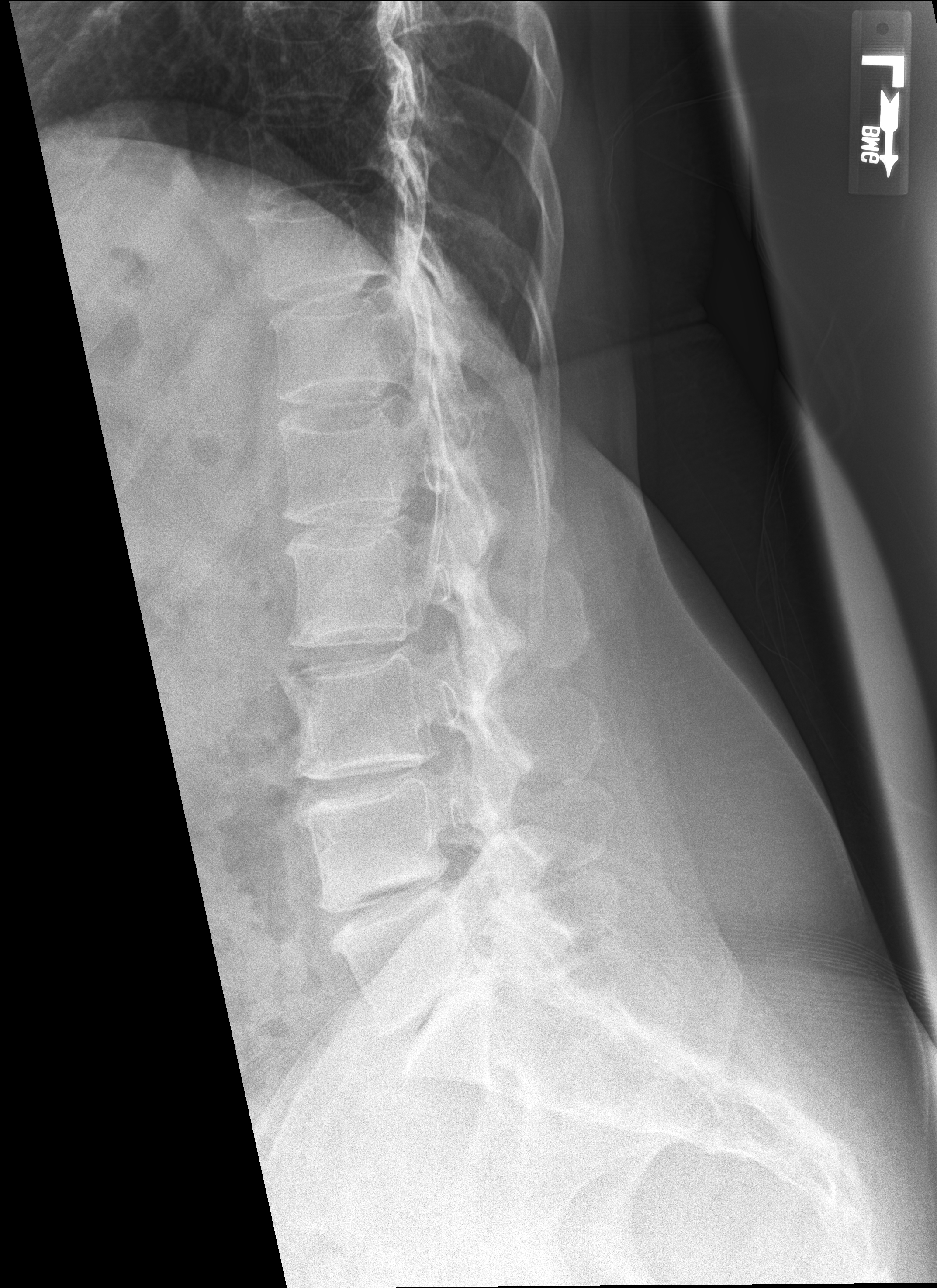

[l-spine l5-s1]
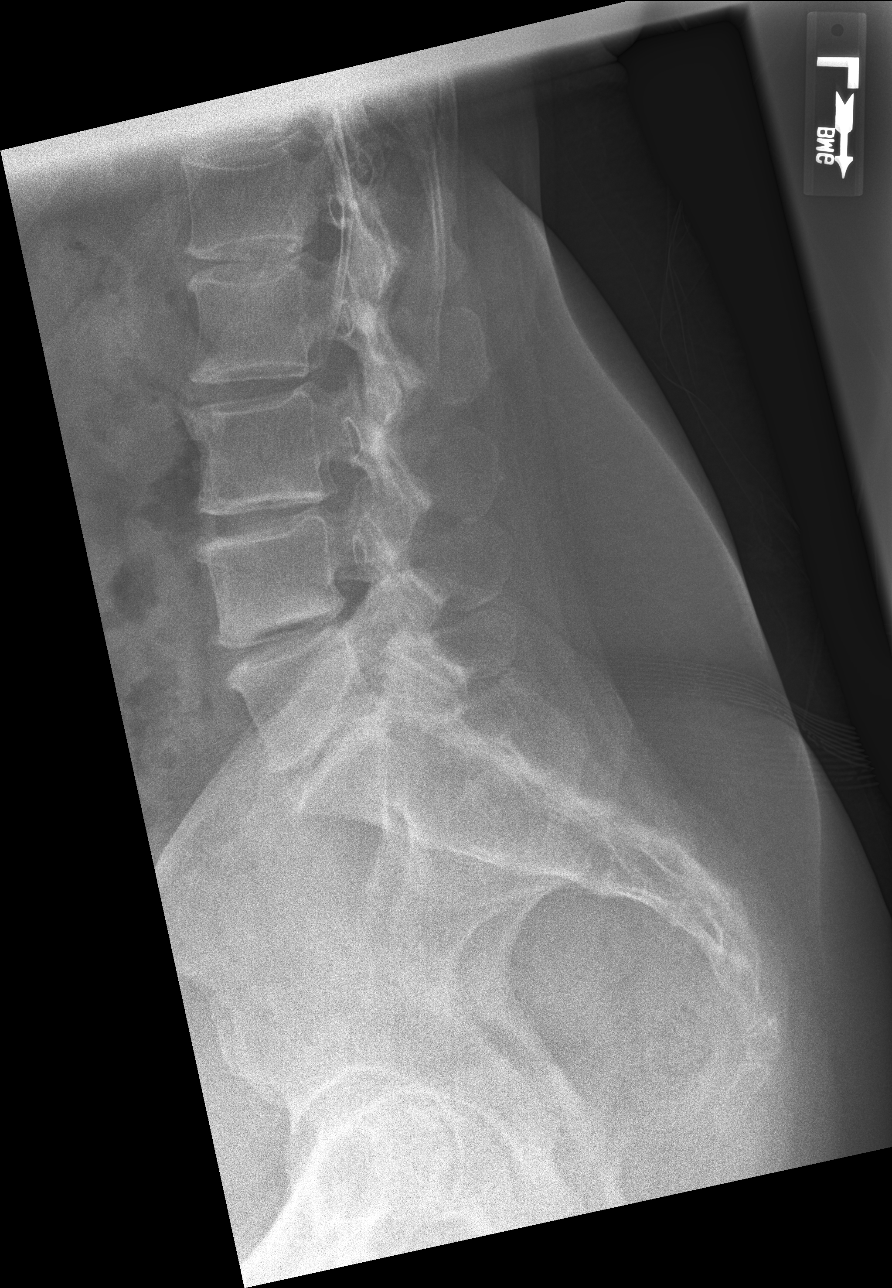

[5 of 5 positions shown; findings below may reference images not displayed]

FINDINGS: Five lumbar type vertebral bodies are well visualized. Vertebral
body height is well maintained. No pars defects are seen. No
anterolisthesis is noted. Osteophytic changes are seen from L2-S1.
Disc space narrowing is noted at L4-5 and L5-S1.
IMPRESSION: Progressive degenerative changes when compared with the prior exam.

## 2022-11-04 ENCOUNTER — Ambulatory Visit
Admission: EM | Admit: 2022-11-04 | Discharge: 2022-11-04 | Disposition: A | Discharge status: Home / Self Care | Payer: Medicare HMO | Attending: Nurse Practitioner | Admitting: Nurse Practitioner

## 2022-11-04 DIAGNOSIS — K029 Dental caries, unspecified: Secondary | ICD-10-CM

## 2022-11-04 DIAGNOSIS — K047 Periapical abscess without sinus: Secondary | ICD-10-CM | POA: Diagnosis not present

## 2022-11-04 MED ORDER — AMOXICILLIN 500 MG PO CAPS: 500.0000 mg | ORAL_CAPSULE | Freq: Three times a day (TID) | ORAL | 0 refills | Status: AC

## 2022-11-04 NOTE — ED Provider Notes (Addendum)
[facility]    CSN: [id number] Arrival date & time: 11/04/22  1300      History   Chief Complaint Chief Complaint  Patient presents with   Dental Pain    HPI Rachael Powell is a 72 y.o. female.   HPI  She is in today for right sided upper mouth pain. She reports that her last dental cleaning was a deep clean. She has broken crown and now the old tooth is causing pain. She is not able eat solids. She is eating soup. Denies fever, chills, headache, dizziness, visual changes, shortness of breath, dyspnea on exertion, chest pain, nausea or vomiting.  Past Medical History:  Diagnosis Date   Chicken pox    Chronic kidney disease    right renal stone   Colon polyps    Depression    no medication   Frequent headaches    GERD (gastroesophageal reflux disease)    Hypothyroidism    possible low thyroid. 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      Home Medications    Prior to Admission medications   Medication Sig Start Date End Date Taking? Authorizing Provider  amoxicillin (AMOXIL) 500 MG capsule Take 1 capsule (500 mg total) by mouth 3 (three) times daily for 7 days. 11/04/22 11/11/22 Yes Vevelyn Francois, NP  acetaminophen (TYLENOL 8 HOUR ARTHRITIS PAIN) 650 MG CR tablet Take 1 tablet (650 mg total) by mouth every 8 (eight) hours as needed for pain. 12/13/19   Forrest Moron, MD  albuterol (VENTOLIN HFA) 108 (90 Base) MCG/ACT inhaler Inhale 2 puffs into the lungs every 4 (four) hours as needed  for wheezing or shortness of breath. 11/06/20   Thurnell Lose, MD  Ascorbic Acid (VITAMIN C) 1000 MG tablet Take 1,000 mg by mouth daily.    [provider]  Black Elderberry (SAMBUCUS ELDERBERRY) 50 MG/5ML SYRP Take 5 mLs by mouth daily.    [provider]  methylPREDNISolone (MEDROL DOSEPAK) 4 MG TBPK tablet follow package directions 11/06/20   Thurnell Lose, MD  Omega-3 Fatty Acids (FISH OIL) 1000 MG CAPS Take 1,000 mg by mouth daily.    [provider]  Specialty Vitamins Products (ECHINACEA C COMPLETE PO) Take 1 tablet by mouth daily.    [provider]    Family History Family History  Problem Relation Age of Onset   Diabetes Father     Social History Social History   Tobacco Use   Smoking status: Former    Packs/day: 1.00    Years: 3.00    Total pack years: 3.00    Types: Cigarettes   Smokeless tobacco: Never  Vaping Use   Vaping Use: Never used  Substance Use Topics   Alcohol use: No   Drug use: No     Allergies   Patient has no known allergies.   Review of Systems Review of Systems  Physical Exam Triage Vital Signs ED Triage Vitals [11/04/22 1308]  Enc Vitals Group     BP (!) 160/75     Pulse Rate 70     Resp 16     Temp (!) 97.5 F (36.4 C)     Temp Source Oral     SpO2 98 %     Weight      Height      Head Circumference      Peak Flow      Pain Score 10     Pain Loc      Pain Edu?      Excl. in [place]?    No data found.  Updated Vital Signs BP (!) 160/75 (BP Location: Right Arm)   Pulse 70   Temp (!) 97.5 F (36.4 C) (Oral)   Resp 16   SpO2 98%   Visual Acuity Right Eye Distance:   Left Eye Distance:   Bilateral Distance:    Right Eye Near:   Left Eye Near:    Bilateral Near:     Physical Exam Constitutional:      General: She is not in acute distress.    Appearance: She is normal weight. She is not ill-appearing, toxic-appearing or diaphoretic.  HENT:     Head: Normocephalic and  atraumatic.     Mouth/Throat:     Mouth: Mucous membranes are moist.   Cardiovascular:     Rate and Rhythm: Normal rate and regular rhythm.     Pulses: Normal pulses.  Pulmonary:     Effort: Pulmonary effort is normal.  Musculoskeletal:        General: Normal range of motion.     Cervical back: Normal range of motion.  Skin:    General: Skin is warm and dry.     Capillary Refill: Capillary refill takes less than 2 seconds.  Neurological:     General: No focal deficit present.     Mental Status: She is alert and oriented to person, place, and time.     UC Treatments / Results  Labs (all labs ordered are listed, but only abnormal results are displayed) Labs Reviewed - No data to display  EKG   Radiology No results found.  Procedures Procedures (including critical care time)  Medications Ordered in UC Medications - No data to display  Initial Impression / Assessment and Plan / UC Course  I have reviewed the triage vital signs and the nursing notes.  Pertinent labs & imaging results that were available during my care of the patient were reviewed by me and considered in my medical decision making (see chart for details).     Dental pain  Final Clinical Impressions(s) / UC Diagnoses   Final diagnoses:  Pain due to dental caries  Dental abscess     Discharge Instructions      You have a dental abscess and have been prescribed Amoxicillin 500 mg three times a day for 7 days.  Follow up with dentist as discussed.      ED Prescriptions     Medication Sig Dispense Auth. Provider   amoxicillin (AMOXIL) 500 MG capsule Take 1 capsule (500 mg total) by mouth 3 (three) times daily for 7 days. 21 capsule Vevelyn Francois, NP      PDMP not reviewed this encounter.   Vevelyn Francois, NP 11/04/22 1349    Vevelyn Francois, NP 11/04/22 [phone]

## 2022-11-04 NOTE — Discharge Instructions (Addendum)
You have a dental abscess and have been prescribed Amoxicillin 500 mg three times a day for 7 days.  Follow up with dentist as discussed.

## 2022-11-04 NOTE — ED Triage Notes (Signed)
Pt c/o top right toothache onset sometime last week
